# Patient Record
Sex: Male | Born: 1955 | Race: White | Hispanic: No | Marital: Married | Smoking: Current every day smoker
Health system: Southern US, Community
[De-identification: ages and names within clinical notes are randomized; demographics above are authoritative.]

## PROBLEM LIST (undated history)

## (undated) DIAGNOSIS — I10 Essential (primary) hypertension: Secondary | ICD-10-CM

## (undated) HISTORY — PX: ABDOMINAL SURGERY: SHX537

## (undated) HISTORY — DX: Essential (primary) hypertension: I10

---

## 2009-11-29 ENCOUNTER — Encounter: Admission: RE | Admit: 2009-11-29 | Discharge: 2009-11-29 | Payer: Self-pay | Admitting: Internal Medicine

## 2013-02-16 ENCOUNTER — Other Ambulatory Visit: Payer: Self-pay | Admitting: Internal Medicine

## 2013-02-16 ENCOUNTER — Ambulatory Visit
Admission: RE | Admit: 2013-02-16 | Discharge: 2013-02-16 | Disposition: A | Discharge status: Home / Self Care | Payer: 59 | Source: Ambulatory Visit | Attending: Internal Medicine | Admitting: Internal Medicine

## 2013-02-16 DIAGNOSIS — R52 Pain, unspecified: Secondary | ICD-10-CM

## 2013-02-16 DIAGNOSIS — F17200 Nicotine dependence, unspecified, uncomplicated: Secondary | ICD-10-CM

## 2013-02-17 ENCOUNTER — Other Ambulatory Visit (HOSPITAL_BASED_OUTPATIENT_CLINIC_OR_DEPARTMENT_OTHER): Payer: Self-pay | Admitting: Internal Medicine

## 2013-02-17 ENCOUNTER — Other Ambulatory Visit: Payer: Self-pay | Admitting: Internal Medicine

## 2013-02-17 DIAGNOSIS — R52 Pain, unspecified: Secondary | ICD-10-CM

## 2013-02-17 DIAGNOSIS — R109 Unspecified abdominal pain: Secondary | ICD-10-CM

## 2013-02-18 ENCOUNTER — Other Ambulatory Visit (HOSPITAL_BASED_OUTPATIENT_CLINIC_OR_DEPARTMENT_OTHER): Payer: 59

## 2013-02-19 ENCOUNTER — Other Ambulatory Visit: Payer: 59

## 2013-02-25 ENCOUNTER — Ambulatory Visit
Admission: RE | Admit: 2013-02-25 | Discharge: 2013-02-25 | Disposition: A | Discharge status: Home / Self Care | Payer: 59 | Source: Ambulatory Visit | Attending: Internal Medicine | Admitting: Internal Medicine

## 2013-02-25 DIAGNOSIS — R52 Pain, unspecified: Secondary | ICD-10-CM

## 2013-02-26 ENCOUNTER — Other Ambulatory Visit (HOSPITAL_COMMUNITY): Payer: Self-pay | Admitting: Internal Medicine

## 2013-02-26 DIAGNOSIS — R1011 Right upper quadrant pain: Secondary | ICD-10-CM

## 2013-03-04 ENCOUNTER — Other Ambulatory Visit (HOSPITAL_COMMUNITY): Payer: 59

## 2013-03-09 ENCOUNTER — Encounter (HOSPITAL_COMMUNITY): Payer: 59

## 2015-09-07 ENCOUNTER — Other Ambulatory Visit: Payer: Self-pay | Admitting: Internal Medicine

## 2015-09-07 ENCOUNTER — Ambulatory Visit
Admission: RE | Admit: 2015-09-07 | Discharge: 2015-09-07 | Disposition: A | Discharge status: Home / Self Care | Payer: 59 | Source: Ambulatory Visit | Attending: Internal Medicine | Admitting: Internal Medicine

## 2015-09-07 DIAGNOSIS — R52 Pain, unspecified: Secondary | ICD-10-CM

## 2015-09-14 ENCOUNTER — Encounter (INDEPENDENT_AMBULATORY_CARE_PROVIDER_SITE_OTHER): Payer: Self-pay

## 2015-09-14 ENCOUNTER — Ambulatory Visit (INDEPENDENT_AMBULATORY_CARE_PROVIDER_SITE_OTHER): Payer: 59 | Admitting: Rehabilitative and Restorative Service Providers"

## 2015-09-14 ENCOUNTER — Encounter: Payer: Self-pay | Admitting: Rehabilitative and Restorative Service Providers"

## 2015-09-14 DIAGNOSIS — R29898 Other symptoms and signs involving the musculoskeletal system: Secondary | ICD-10-CM

## 2015-09-14 DIAGNOSIS — M5441 Lumbago with sciatica, right side: Secondary | ICD-10-CM

## 2015-09-14 DIAGNOSIS — Z7409 Other reduced mobility: Secondary | ICD-10-CM | POA: Diagnosis not present

## 2015-09-14 DIAGNOSIS — M2560 Stiffness of unspecified joint, not elsewhere classified: Secondary | ICD-10-CM | POA: Diagnosis not present

## 2015-09-14 DIAGNOSIS — R531 Weakness: Secondary | ICD-10-CM

## 2015-09-14 DIAGNOSIS — R6889 Other general symptoms and signs: Secondary | ICD-10-CM

## 2015-09-14 NOTE — Patient Instructions (Signed)
Trunk Extension   Standing, place back of open hands on low back. Straighten spine then arch the back and move shoulders back. Repeat ___2-3_ times per session. Pause 1-2 seconds. Do __several times/day Variation: Seated   Trunk: Prone Extension (Press-Ups)   Lie on stomach on firm, flat surface. Relax bottom and legs. Raise chest in air with elbows straight. Keep hips flat on surface, sag stomach. Hold _2-3___ seconds. Repeat __10__ times. Do _at least 3 times/day - more as needed. CAUTION: Movement should be gentle and slow.  Abdominal Bracing With Pelvic Floor (Hook-Lying)   With neutral spine, tighten pelvic floor and abdominals - suck belly button to back bone, tighten back muscles at waist. Hold 10 sec. Repeat __10_ times. Do _several__ times a day. Work toward doing this in sitting, standing, walking and with functional activities.

## 2015-09-14 NOTE — Therapy (Signed)
[facility] [place] [address] [place], [place], [address] Phone: [phone]   Fax:  [phone]  Physical Therapy Evaluation  Patient Details  Name: Matthew Escobar MRN: [id number] Date of Birth: [birth year] Referring Provider:  Ralene Ok, MD  Encounter Date: 09/14/2015      PT End of Session - 09/14/15 1315    Visit Number 1   Number of Visits 12   Date for PT Re-Evaluation 10/26/15   PT Start Time 1158   PT Stop Time 1259   PT Time Calculation (min) 61 min   Activity Tolerance Patient tolerated treatment well      History reviewed. No pertinent past medical history.  History reviewed. No pertinent past surgical history.  There were no vitals filed for this visit.  Visit Diagnosis:  Bilateral low back pain with right-sided sciatica - Plan: PT plan of care cert/re-cert  Stiffness in joint - Plan: PT plan of care cert/re-cert  Weakness of right lower extremity - Plan: PT plan of care cert/re-cert  Decreased strength, endurance, and mobility - Plan: PT plan of care cert/re-cert      Subjective Assessment - 09/14/15 1159    Subjective Patient reports that he was at a store squatting to lift a box of caulk weighing ~30 pounds and felt severe pain in LB. Had to walk bent forward. Then he started having muscle spasms which have continued since time of injury ~2 weeks ago.    Pertinent History History of recurrent LBP due to work tasks. Episodes of LBP occuring 2-3 times per year for the past 2-3 years. History of LBP with chiropractic care for several years. HTN; elevated cholestrol; borderline AODM; Fx Rt tib/fib 1996 ORIF - leg length difference treated with lift by chiropractor   How long can you sit comfortably? 30  min - feels back tighening up - has pain in LB when he stands to walk    How long can you stand comfortably? no limit   How long can you walk comfortably? pain when he starts walking for the 2-3 min then  no limit   Diagnostic tests xray (-)   Patient Stated Goals getrid of the pain   Currently in Pain? Yes   Pain Score 8    Pain Location Back   Pain Orientation Right   Pain Type Acute pain   Pain Radiating Towards Rt back and into the Rt hip at times down side of the Rt to toes - muscle spasms toward mid back ~6 inches   Pain Onset 1 to 4 weeks ago   Pain Frequency Constant   Aggravating Factors  sitting   Pain Relieving Factors standing; walking; lying flat            [facility] PT Assessment - 09/14/15 0001    Assessment   Medical Diagnosis LBP   Onset Date/Surgical Date 08/31/15   Hand Dominance Left   Next MD Visit 09/19/15   Prior Therapy none this incident - PT at ~59 yr old for LB   Precautions   Precautions None   Balance Screen   Has the patient fallen in the past 6 months No   Has the patient had a decrease in activity level because of a fear of falling?  No   Is the patient reluctant to leave their home because of a fear of falling?  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   Leisure corn hole watching races; yard work; household chores to help wife; walking 3 x/wk ~ 60 min   Observation/Other Assessments   Focus on Therapeutic Outcomes (FOTO)  61% limitaion   Sensation   Additional Comments Some numbness into Rt LE depends on activity level intermittent with work activities   Posture/Postural Control   Posture/Postural Control --  leg length difference standing/supine Rt shorter ~ 3/8 inch   Posture Comments head forward increased thoracic kyphosis; decresaed lumbar lordosis; Rt hemipelvis lower than Lt   AROM   Lumbar Flexion 75%  pain   Lumbar Extension 60%  pain   Lumbar - Right Side Bend 55%  pinch    Lumbar - Left Side Bend 50%  pain/pull Rt LB   Lumbar - Right Rotation 25%  pull/pain   Lumbar - Left Rotation 15%  pain Rt    Strength   Strength Assessment Site --  Bilat LE's 5/5 except Rt hip extension 5-/5   Flexibility   Soft Tissue Assessment /Muscle Length --  Lt knee to chest produced pain in the Rt LB   Hamstrings Rt 67 deg; Lt 79 deg   ITB tight bilat   Piriformis tight Lt > Rt   Palpation   Spinal mobility pain with LPA mobs L5   Palpation comment tightness Rt pelvic crest distal to SI joint; hip abductors   Special Tests    Special Tests --  SLR; figure 4; prone knee flexion (-)                   [facility] Adult PT Treatment/Exercise - 09/14/15 0001    Self-Care   Self-Care --  education re spine care; body mechanics; positioning   Lumbar Exercises: Stretches   Standing Extension 5 reps  2-3 sec   Prone on Elbows Stretch --  2-3 min    Press Ups --  10 reps 1-2 sec hold   Lumbar Exercises: Supine   Ab Set --  3 part core difficulty with multifidi 10 sec hold 10 reps   Cryotherapy   Number Minutes Cryotherapy 15 Minutes   Cryotherapy Location Lumbar Spine   Type of Cryotherapy Ice pack   Electrical Stimulation   Electrical Stimulation Location bilat L5; Rt hip   Electrical Stimulation Action IFC   Electrical Stimulation Parameters to tolerance   Electrical Stimulation Goals Pain                PT Education - 09/14/15 1256    Education provided Yes   Education Details Education re LBP discogenic in nature; ergonomic positioning; extensioin program; HEP; trial of 1/4 inch lift for Rt shoe             PT Long Term Goals - 09/14/15 1320    PT LONG TERM GOAL #1   Title Patient I in HEP at discharge 10/26/15   Time 6   Period Weeks   Status New   PT LONG TERM GOAL #2   Title 5/5 strength Rt hip extension/LE 10/26/15   Time 6   Period Weeks   Status New   PT LONG TERM GOAL #3   Title Hamstring flexibility 75-80 degress bilat  10/26/15   Time 6   Period Weeks   Status New   PT LONG TERM GOAL #4  Title Increased sitting tolerance to 60 min 10/26/15   Time 6   Period Weeks   Status New   PT LONG TERM GOAL #5   Title Improve FOTO to </= 35% limitation 10/26/15   Time 6   Period Weeks   Status New               Plan - 09/14/15 1316    Clinical Impression Statement Patient presents with acute LBP which is recurrent in nature including 2-3 episodes/year. He has limited and painful trunk mobility; decreased flexibility bilat LE's; weakness; pain with functioinal acitivities; limited endurance for functioinal activities; pain on a constant , daily basis. He also has a leg length difference Rt shorter than Lt. Patient will benefit from Physical Therapy to address problems identifited.    Pt will benefit from skilled therapeutic intervention in order to improve on the following deficits Improper body mechanics;Postural dysfunction;Decreased range of motion;Decreased mobility;Decreased strength;Decreased endurance;Decreased activity tolerance;Pain   Rehab Potential Good   PT Frequency 2x / week   PT Duration 6 weeks   PT Treatment/Interventions Patient/family education;ADLs/Self Care Home Management;Therapeutic exercise;Therapeutic activities;Cryotherapy;Electrical Stimulation;Moist Heat;Ultrasound;Dry needling;Manual techniques   PT Next Visit Plan assess response to heel lift and HEP; progress core stabilization; extension program   PT Home Exercise Plan positioning; extension program; ~1/4 inchheel lift Rt   Consulted and Agree with Plan of Care Patient         Problem List There are no active problems to display for this patient.   Celyn [name] PT, MPH 09/14/2015, 1:34 PM  [facility] [address] [place], [place], [address] Phone: [phone]   Fax:  [phone]

## 2015-09-19 ENCOUNTER — Ambulatory Visit (INDEPENDENT_AMBULATORY_CARE_PROVIDER_SITE_OTHER): Payer: 59 | Admitting: Rehabilitative and Restorative Service Providers"

## 2015-09-19 ENCOUNTER — Encounter: Payer: Self-pay | Admitting: Rehabilitative and Restorative Service Providers"

## 2015-09-19 DIAGNOSIS — M2560 Stiffness of unspecified joint, not elsewhere classified: Secondary | ICD-10-CM

## 2015-09-19 DIAGNOSIS — M5441 Lumbago with sciatica, right side: Secondary | ICD-10-CM

## 2015-09-19 DIAGNOSIS — Z7409 Other reduced mobility: Secondary | ICD-10-CM | POA: Diagnosis not present

## 2015-09-19 DIAGNOSIS — R29898 Other symptoms and signs involving the musculoskeletal system: Secondary | ICD-10-CM

## 2015-09-19 DIAGNOSIS — R6889 Other general symptoms and signs: Secondary | ICD-10-CM

## 2015-09-19 DIAGNOSIS — R531 Weakness: Secondary | ICD-10-CM

## 2015-09-19 NOTE — Therapy (Signed)
[facility] [place] [address] [place], [place], [address] Phone: [phone]   Fax:  [phone]  Physical Therapy Treatment  Patient Details  Name: Matthew Escobar MRN: [id number] Date of Birth: [birth year] Referring Provider:  Ralene Ok, MD  Encounter Date: 09/19/2015      PT End of Session - 09/19/15 1404    Visit Number 2   Number of Visits 12   Date for PT Re-Evaluation 10/26/15   PT Start Time 1405   PT Stop Time 1458   PT Time Calculation (min) 53 min   Activity Tolerance Patient tolerated treatment well      History reviewed. No pertinent past medical history.  History reviewed. No pertinent past surgical history.  There were no vitals filed for this visit.  Visit Diagnosis:  Bilateral low back pain with right-sided sciatica  Stiffness in joint  Weakness of right lower extremity  Decreased strength, endurance, and mobility      Subjective Assessment - 09/19/15 1409    Subjective Patient reports that he continues to have some muscle spasms just not as intense. He states that his back is 'no better, no worse'.    Currently in Pain? Yes   Pain Score 8    Pain Location Back   Pain Orientation Right   Pain Descriptors / Indicators Throbbing;Aching;Pounding   Pain Type Acute pain   Pain Onset 1 to 4 weeks ago   Pain Frequency Constant            [facility] PT Assessment - 09/19/15 0001    Assessment   Medical Diagnosis LBP   Onset Date/Surgical Date 08/31/15   Hand Dominance Left   Next MD Visit 09/19/15   Prior Therapy none this incident - PT at ~59 yr old for LB   AROM   Lumbar Extension 60%  pain   Palpation   Spinal mobility pain with LPA mobs L5   Palpation comment tightness Rt pelvic crest distal to SI joint; hip abductors                     [facility] Adult PT Treatment/Exercise - 09/19/15 0001    Self-Care   Self-Care --  education re spine care; body mechanics; positioning    Lumbar Exercises: Stretches   Standing Extension 5 reps  2-3 sec   Prone on Elbows Stretch --  2-3 min    Press Ups --  10 reps 1-2 sec hold   Lumbar Exercises: Machines for Strengthening   Other Lumbar Machine Exercise UBE L2 4 min 2 frd 2 back   Lumbar Exercises: Supine   Ab Set --  3 part core difficulty with multifidi 10 sec hold 10 reps   Cryotherapy   Number Minutes Cryotherapy 15 Minutes   Cryotherapy Location Lumbar Spine   Type of Cryotherapy Ice pack   Electrical Stimulation   Electrical Stimulation Location bilat L5; Rt hip   Electrical Stimulation Action IFC   Electrical Stimulation Parameters to tolerance   Electrical Stimulation Goals Pain   Ultrasound   Ultrasound Location Rt lumbar and SI area   Ultrasound Parameters 1 mHz; 1.5 w/cm2; 100%; 8 min   Ultrasound Goals Pain  muscle tightness   Manual Therapy   Joint Mobilization CPA mobs grade II L4/5/S1; Rt transverse processes L5/4   Soft tissue mobilization deep tissue work through [employer] lumbar/QL/SI area    Myofascial Release lumbar release using the edge tool  PT Education - 09/19/15 1551    Education provided Yes   Education Details continue education re-spine care; ergonomics; cont with lift; HEP - extension program   Person(s) Educated Patient   Methods Explanation   Comprehension Verbalized understanding             PT Long Term Goals - 09/14/15 1320    PT LONG TERM GOAL #1   Title Patient I in HEP at discharge 10/26/15   Time 6   Period Weeks   Status New   PT LONG TERM GOAL #2   Title 5/5 strength Rt hip extension/LE 10/26/15   Time 6   Period Weeks   Status New   PT LONG TERM GOAL #3   Title Hamstring flexibility 75-80 degress bilat 10/26/15   Time 6   Period Weeks   Status New   PT LONG TERM GOAL #4   Title Increased sitting tolerance to 60 min 10/26/15   Time 6   Period Weeks   Status New   PT LONG TERM GOAL #5   Title Improve FOTO to </= 35%  limitation 10/26/15   Time 6   Period Weeks   Status New               Plan - 09/19/15 1413    Clinical Impression Statement Cont pain which appears discogenic in nature. Cont limited and painful trunk mobility; decreased flexibility bilat LE's; weakness; pain with functional activities; pain. He states that he has adjusted to the lift in Rt LE. Pain persists. No goals accomplished.   Pt will benefit from skilled therapeutic intervention in order to improve on the following deficits Improper body mechanics;Postural dysfunction;Decreased range of motion;Decreased mobility;Decreased strength;Decreased endurance;Decreased activity tolerance;Pain   Rehab Potential Good   PT Frequency 2x / week   PT Duration 6 weeks   PT Treatment/Interventions Patient/family education;ADLs/Self Care Home Management;Therapeutic exercise;Therapeutic activities;Cryotherapy;Electrical Stimulation;Moist Heat;Ultrasound;Dry needling;Manual techniques   PT Next Visit Plan assess response to heel lift and HEP; progress core stabilization; extension program; add supine hamstring stretch   PT Home Exercise Plan positioning; extension program; ~1/4 inchheel lift Rt   Consulted and Agree with Plan of Care Patient        Problem List There are no active problems to display for this patient.   Latia Mataya [name] PT, MPH 09/19/2015, 4:00 PM  [facility] [address] [place], [place], [address] Phone: [phone]   Fax:  [phone]

## 2015-09-21 ENCOUNTER — Ambulatory Visit (INDEPENDENT_AMBULATORY_CARE_PROVIDER_SITE_OTHER): Payer: 59 | Admitting: Physical Therapy

## 2015-09-21 DIAGNOSIS — Z7409 Other reduced mobility: Secondary | ICD-10-CM

## 2015-09-21 DIAGNOSIS — M2560 Stiffness of unspecified joint, not elsewhere classified: Secondary | ICD-10-CM

## 2015-09-21 DIAGNOSIS — M5441 Lumbago with sciatica, right side: Secondary | ICD-10-CM | POA: Diagnosis not present

## 2015-09-21 DIAGNOSIS — R29898 Other symptoms and signs involving the musculoskeletal system: Secondary | ICD-10-CM | POA: Diagnosis not present

## 2015-09-21 DIAGNOSIS — R531 Weakness: Secondary | ICD-10-CM

## 2015-09-21 NOTE — Therapy (Signed)
[facility] [place] [address] [place], [place], [address] Phone: [phone]   Fax:  [phone]  Physical Therapy Treatment  Patient Details  Name: Matthew Escobar MRN: [id number] Date of Birth: [birth year] Referring Provider:  Ralene Ok, MD  Encounter Date: 09/21/2015      PT End of Session - 09/21/15 1406    Visit Number 3   Number of Visits 12   Date for PT Re-Evaluation 10/26/15   PT Start Time 1403   PT Stop Time 1456   PT Time Calculation (min) 53 min   Activity Tolerance Patient tolerated treatment well      No past medical history on file.  No past surgical history on file.  There were no vitals filed for this visit.  Visit Diagnosis:  Bilateral low back pain with right-sided sciatica  Stiffness in joint  Weakness of right lower extremity  Decreased strength, endurance, and mobility      Subjective Assessment - 09/21/15 1408    Subjective Pt reports his pain went from an 8/10 to 2/10 after last session.  Pain has increased since then, but not as high. Pt reports he is performing HEP 2x/day.  Pt notes he is able to sit 1 hr 15 min before increased pain.    Currently in Pain? Yes   Pain Score 4    Pain Location Back   Pain Orientation Right   Pain Descriptors / Indicators Aching   Aggravating Factors  sitting    Pain Relieving Factors standing, walking, lying flat, TENS unit.             [facility] PT Assessment - 09/21/15 0001    Assessment   Medical Diagnosis LBP   Onset Date/Surgical Date 08/31/15   Hand Dominance Left   Next MD Visit January                      [facility] Adult PT Treatment/Exercise - 09/21/15 0001    Lumbar Exercises: Stretches   Passive Hamstring Stretch 2 reps;30 seconds   Passive Hamstring Stretch Limitations (supine with strap)   Prone on Elbows Stretch --  2-3 min    Press Ups --  10 reps 1-2 sec hold   Lumbar Exercises: Aerobic   Stationary Bike NuStep  L4: 5 min    Lumbar Exercises: Supine   Ab Set 5 reps;5 seconds   Clam 10 reps  each leg with ab set   Heel Slides 10 reps  each leg with ab set   Bent Knee Raise 20 reps  each leg with ab set   Lumbar Exercises: Prone   Other Prone Lumbar Exercises Prone pelvic press with 5 sec hold x 5 reps (required tactile cues and demo for form)    Programme researcher, broadcasting/film/video Location Rt lumbar paraspinals / SI joint/ glute med    Teacher, music Stimulation Goals Pain   Ultrasound   Ultrasound Location Rt lumbar paraspinals/ SI/.glute med   Ultrasound Parameters 1 mHz, 1.3 w/cm2, 8 min 100%.    Ultrasound Goals Pain                PT Education - 09/21/15 1504    Education provided Yes   Education Details HEP - added hamstring stretch and trans abd series.  Also educated on log roll, core engagement with transitional movements.     Person(s) Educated Patient   Methods Explanation;Handout   Comprehension  Returned demonstration;Verbalized understanding             PT Long Term Goals - 09/14/15 1320    PT LONG TERM GOAL #1   Title Patient I in HEP at discharge 10/26/15   Time 6   Period Weeks   Status New   PT LONG TERM GOAL #2   Title 5/5 strength Rt hip extension/LE 10/26/15   Time 6   Period Weeks   Status New   PT LONG TERM GOAL #3   Title Hamstring flexibility 75-80 degress bilat 10/26/15   Time 6   Period Weeks   Status New   PT LONG TERM GOAL #4   Title Increased sitting tolerance to 60 min 10/26/15   Time 6   Period Weeks   Status New   PT LONG TERM GOAL #5   Title Improve FOTO to </= 35% limitation 10/26/15   Time 6   Period Weeks   Status New               Plan - 09/21/15 1505    Clinical Impression Statement Pt reporting decreased pain to 4/10 today. Pt tolerated all new exercises without increase in pain. Pt able to demo proper body mechanics (with VC) for transitions like supine to sit  and sit to stand.  Pt reported further reduction in pain at end of session to 2/10. Progressing towards goals.     Pt will benefit from skilled therapeutic intervention in order to improve on the following deficits Improper body mechanics;Postural dysfunction;Decreased range of motion;Decreased mobility;Decreased strength;Decreased endurance;Decreased activity tolerance;Pain   Rehab Potential Good   PT Frequency 2x / week   PT Duration 6 weeks   PT Treatment/Interventions Patient/family education;ADLs/Self Care Home Management;Therapeutic exercise;Therapeutic activities;Cryotherapy;Electrical Stimulation;Moist Heat;Ultrasound;Dry needling;Manual techniques   PT Next Visit Plan Continue progressive strengthening of core and stretching of LE.   Continue extension program.    Consulted and Agree with Plan of Care Patient        Problem List There are no active problems to display for this patient.  Mayer Camel, PTA 09/21/2015 3:12 PM  [facility] [place] [address] [place], [place], [address] Phone: [phone]   Fax:  [phone]

## 2015-09-26 ENCOUNTER — Encounter: Payer: Self-pay | Admitting: Family Medicine

## 2015-09-26 ENCOUNTER — Encounter: Payer: Self-pay | Admitting: Rehabilitative and Restorative Service Providers"

## 2015-09-26 ENCOUNTER — Ambulatory Visit (INDEPENDENT_AMBULATORY_CARE_PROVIDER_SITE_OTHER): Payer: 59 | Admitting: Family Medicine

## 2015-09-26 ENCOUNTER — Ambulatory Visit (INDEPENDENT_AMBULATORY_CARE_PROVIDER_SITE_OTHER): Payer: 59 | Admitting: Rehabilitative and Restorative Service Providers"

## 2015-09-26 VITALS — BP 155/96 | HR 80 | Wt 234.0 lb

## 2015-09-26 DIAGNOSIS — R531 Weakness: Secondary | ICD-10-CM

## 2015-09-26 DIAGNOSIS — Z7409 Other reduced mobility: Secondary | ICD-10-CM

## 2015-09-26 DIAGNOSIS — M5430 Sciatica, unspecified side: Secondary | ICD-10-CM

## 2015-09-26 DIAGNOSIS — M5440 Lumbago with sciatica, unspecified side: Secondary | ICD-10-CM

## 2015-09-26 DIAGNOSIS — R6889 Other general symptoms and signs: Secondary | ICD-10-CM

## 2015-09-26 DIAGNOSIS — R29898 Other symptoms and signs involving the musculoskeletal system: Secondary | ICD-10-CM | POA: Diagnosis not present

## 2015-09-26 DIAGNOSIS — M2560 Stiffness of unspecified joint, not elsewhere classified: Secondary | ICD-10-CM | POA: Diagnosis not present

## 2015-09-26 DIAGNOSIS — M545 Low back pain: Secondary | ICD-10-CM

## 2015-09-26 DIAGNOSIS — M5416 Radiculopathy, lumbar region: Secondary | ICD-10-CM | POA: Insufficient documentation

## 2015-09-26 MED ORDER — TRAMADOL HCL 50 MG PO TABS: 50.0000 mg | ORAL_TABLET | Freq: Three times a day (TID) | ORAL | Status: DC | PRN

## 2015-09-26 NOTE — Assessment & Plan Note (Signed)
Patient is improving with conservative management.  Plan to continue prednisone physical therapy and Flexeril. Tramadol for pain control. Return in 2 weeks. If not better at that time would obtain MRI for planning for epidural steroid injection.

## 2015-09-26 NOTE — Progress Notes (Signed)
   Subjective:    I'm seeing this patient as a consultation for:  Dr Josefa Half Fax # [phone]  CC: Back pain  HPI: Patient is a 4 week history of right-sided back pain radiating to the right foot. He denies any weakness or numbness bowel bladder dysfunction. He denies any injury. No fevers chills nausea vomiting or diarrhea. He's been seen by his primary care provider who has obtained an x-ray which was reportedly normal, and prescribed prednisone and Flexeril. Additionally he said a few doses of Norco. Medications are mildly helpful. Additionally he has had a few days of physical therapy which are helpful. He notes a tingling sensation to his right leg occurring sometimes. Symptoms are typically worse with sitting and activity and better with rest.   Past medical history, Surgical history, Family history not pertinant except as noted below, Social history, Allergies, and medications have been entered into the medical record, reviewed, and no changes needed.   Review of Systems: No headache, visual changes, nausea, vomiting, diarrhea, constipation, dizziness, abdominal pain, skin rash, fevers, chills, night sweats, weight loss, swollen lymph nodes, body aches, joint swelling, muscle aches, chest pain, shortness of breath, mood changes, visual or auditory hallucinations.   Objective:    Filed Vitals:   09/26/15 1311  BP: 155/96  Pulse: 80   General: Well Developed, well nourished, and in no acute distress.  Neuro/Psych: Alert and oriented x3, extra-ocular muscles intact, able to move all 4 extremities, sensation grossly intact. Skin: Warm and dry, no rashes noted.  Respiratory: Not using accessory muscles, speaking in full sentences, trachea midline.  Cardiovascular: Pulses palpable, no extremity edema. Abdomen: Does not appear distended. MSK: Back: Nontender to spinal midline. Tender palpation right SI joint. Back range of motion is intact. Negative slump test and Faber test  bilaterally. Negative straight leg raise test bilaterally. Reflexes are hypereflexic but equal bilateral knees and ankles Normal gait. Normal sensation and strength.   X-ray L-spine 09/07/2015 reviewed normal.   No results found for this or any previous visit (from the past 24 hour(s)). No results found.  Impression and Recommendations:   This case required medical decision making of moderate complexity.

## 2015-09-26 NOTE — Patient Instructions (Signed)
Thank you for coming in today. Come back or go to the emergency room if you notice new weakness new numbness problems walking or bowel or bladder problems. Return in 2-3 weeks.   Sciatica Sciatica is pain, weakness, numbness, or tingling along the path of the sciatic nerve. The nerve starts in the lower back and runs down the back of each leg. The nerve controls the muscles in the lower leg and in the back of the knee, while also providing sensation to the back of the thigh, lower leg, and the sole of your foot. Sciatica is a symptom of another medical condition. For instance, nerve damage or certain conditions, such as a herniated disk or bone spur on the spine, pinch or put pressure on the sciatic nerve. This causes the pain, weakness, or other sensations normally associated with sciatica. Generally, sciatica only affects one side of the body. CAUSES   Herniated or slipped disc.  Degenerative disk disease.  A pain disorder involving the narrow muscle in the buttocks (piriformis syndrome).  Pelvic injury or fracture.  Pregnancy.  Tumor (rare). SYMPTOMS  Symptoms can vary from mild to very severe. The symptoms usually travel from the low back to the buttocks and down the back of the leg. Symptoms can include:  Mild tingling or dull aches in the lower back, leg, or hip.  Numbness in the back of the calf or sole of the foot.  Burning sensations in the lower back, leg, or hip.  Sharp pains in the lower back, leg, or hip.  Leg weakness.  Severe back pain inhibiting movement. These symptoms may get worse with coughing, sneezing, laughing, or prolonged sitting or standing. Also, being overweight may worsen symptoms. DIAGNOSIS  Your caregiver will perform a physical exam to look for common symptoms of sciatica. He or she may ask you to do certain movements or activities that would trigger sciatic nerve pain. Other tests may be performed to find the cause of the sciatica. These may  include:  Blood tests.  X-rays.  Imaging tests, such as an MRI or CT scan. TREATMENT  Treatment is directed at the cause of the sciatic pain. Sometimes, treatment is not necessary and the pain and discomfort goes away on its own. If treatment is needed, your caregiver may suggest:  Over-the-counter medicines to relieve pain.  Prescription medicines, such as anti-inflammatory medicine, muscle relaxants, or narcotics.  Applying heat or ice to the painful area.  Steroid injections to lessen pain, irritation, and inflammation around the nerve.  Reducing activity during periods of pain.  Exercising and stretching to strengthen your abdomen and improve flexibility of your spine. Your caregiver may suggest losing weight if the extra weight makes the back pain worse.  Physical therapy.  Surgery to eliminate what is pressing or pinching the nerve, such as a bone spur or part of a herniated disk. HOME CARE INSTRUCTIONS   Only take over-the-counter or prescription medicines for pain or discomfort as directed by your caregiver.  Apply ice to the affected area for 20 minutes, 3-4 times a day for the first 48-72 hours. Then try heat in the same way.  Exercise, stretch, or perform your usual activities if these do not aggravate your pain.  Attend physical therapy sessions as directed by your caregiver.  Keep all follow-up appointments as directed by your caregiver.  Do not wear high heels or shoes that do not provide proper support.  Check your mattress to see if it is too soft. A firm  mattress may lessen your pain and discomfort. SEEK IMMEDIATE MEDICAL CARE IF:   You lose control of your bowel or bladder (incontinence).  You have increasing weakness in the lower back, pelvis, buttocks, or legs.  You have redness or swelling of your back.  You have a burning sensation when you urinate.  You have pain that gets worse when you lie down or awakens you at night.  Your pain is worse  than you have experienced in the past.  Your pain is lasting longer than 4 weeks.  You are suddenly losing weight without reason. MAKE SURE YOU:  Understand these instructions.  Will watch your condition.  Will get help right away if you are not doing well or get worse.   This information is not intended to replace advice given to you by your health care provider. Make sure you discuss any questions you have with your health care provider.   Document Released: 11/27/2001 Document Revised: 08/24/2015 Document Reviewed: 04/13/2012 Elsevier Interactive Patient Education [employer]

## 2015-09-26 NOTE — Patient Instructions (Signed)
Strengthening: Wall Slide    Leaning on wall, tighten core! slowly lower buttocks until thighs are parallel to floor. Hold __10__ seconds. Tighten thigh muscles and return. Repeat __10__ times per set. Do __1-3__ sets per session. Do _2___ sessions per day.

## 2015-09-26 NOTE — Therapy (Signed)
[facility] [place] [address] [place] [address] [place], [place], [address] Phone: [phone]   Fax:  [phone]  Physical Therapy Treatment  Patient Details  Name: Matthew Escobar MRN: [id number] Date of Birth: [birth year] Referring Provider:  Ralene Ok, MD  Encounter Date: 09/26/2015      PT End of Session - 09/26/15 1503    Visit Number 4   Number of Visits 12   Date for PT Re-Evaluation 10/26/15   PT Start Time 1404   PT Stop Time 1503   PT Time Calculation (min) 59 min   Activity Tolerance Patient tolerated treatment well      History reviewed. No pertinent past medical history.  History reviewed. No pertinent past surgical history.  There were no vitals filed for this visit.  Visit Diagnosis:  Stiffness in joint  Weakness of right lower extremity  Decreased strength, endurance, and mobility      Subjective Assessment - 09/26/15 1403    Subjective Patient reports that his back is "pretty good". [place] and did a lot of walking without any pain. - had increased pain following last PT visit. May have been the new exercises. Seen by Dr. Denyse Amass today. To continue with PT for 2 more weeks and if symptoms persist he will consider an MRI   Currently in Pain? Yes   Pain Score 2    Pain Location Back   Pain Orientation Right   Pain Descriptors / Indicators Aching;Tingling   Pain Type Acute pain   Pain Radiating Towards Rt LB to Rt hip - then some tingling in the bottom of the right foot    Pain Onset 1 to 4 weeks ago   Pain Frequency Intermittent   Aggravating Factors  sitting but not as bad   Pain Relieving Factors standing; walking; lying flat; TENS unit            [facility] PT Assessment - 09/26/15 0001    Flexibility   Hamstrings 69 deg Rt; Lt 78 deg   Palpation   Spinal mobility min pain with spring testing L5 - Rt                      [facility] Adult PT Treatment/Exercise -  09/26/15 0001    Therapeutic Activites    Therapeutic Activities --  myofacial ball release work Rt piriformis    Lumbar Exercises: Stretches   Passive Hamstring Stretch 2 reps;30 seconds   Passive Hamstring Stretch Limitations (supine with strap)   Prone on Elbows Stretch --  2-3 min    Press Ups --  10 reps 1-2 sec hold   Lumbar Exercises: Standing   Wall Slides 10 reps  10 sec hold    Lumbar Exercises: Supine   Ab Set 10 reps  10 sec hold   Clam 10 reps  each leg with ab set   Heel Slides 10 reps  each leg with ab set; heel sliding on table   Bent Knee Raise 10 reps  with core engaged   Lumbar Exercises: Prone   Other Prone Lumbar Exercises prone pelvic press 5 reps 5 sec hold VC to avoid using mm in hips/legs   Cryotherapy   Number Minutes Cryotherapy 15 Minutes   Cryotherapy Location Lumbar Spine  Rt hip   Type of Cryotherapy Ice pack   Electrical Stimulation   Electrical Stimulation Location Rt lumbar L5 area and laterally; Rt piriformis origin and mid buttock  Electrical Stimulation Action IFC   Electrical Stimulation Parameters to tolerance   Electrical Stimulation Goals Pain  muscle tightness   Ultrasound   Ultrasound Location Rt lumbar/piriformis area   Ultrasound Parameters 1 mHz; 100% 1.4 w/cm2; 8 min   Ultrasound Goals --  muscular tightness    Manual Therapy   Joint Mobilization CPA mobs grade II L4/5/S1; Rt transverse processes L5/4   Soft tissue mobilization deep tissue work through [employer] area                 PT Education - 09/26/15 1431    Education provided (p) Yes   Education Details (p) working on core stabilization modified exercises added wall slide working on [employer] operational officer) Educated (p) Patient   Methods (p) Explanation;Demonstration;Verbal cues   Comprehension (p) Verbalized understanding;Returned demonstration;Verbal cues required;Tactile cues required             PT Long Term Goals - 09/26/15 1506     PT LONG TERM GOAL #1   Title Patient I in HEP at discharge 10/26/15   Time 6   Period Weeks   Status On-going   PT LONG TERM GOAL #2   Title 5/5 strength Rt hip extension/LE 10/26/15   Time 6   Period Weeks   Status On-going   PT LONG TERM GOAL #3   Title Hamstring flexibility 75-80 degress bilat 10/26/15   Time 6   Period Weeks   Status On-going   PT LONG TERM GOAL #4   Title Increased sitting tolerance to 60 min 10/26/15   Time 6   Period Weeks   Status On-going   PT LONG TERM GOAL #5   Title Improve FOTO to </= 35% limitation 10/26/15   Time 6   Period Weeks   Status On-going               Plan - 09/26/15 1503    Clinical Impression Statement Some significant increase in pain following last visit better day yesterday. improved symptoms when he stays out of sitting and does more walking. Responds well to [place] and deep tissue release work through [employer] area. Reports good improvement overall and increased activity/work tolerance. Progressing toward stated goals of therapy.    Pt will benefit from skilled therapeutic intervention in order to improve on the following deficits Improper body mechanics;Postural dysfunction;Decreased range of motion;Decreased mobility;Decreased strength;Decreased endurance;Decreased activity tolerance;Pain   Rehab Potential Good   PT Frequency 2x / week   PT Duration 6 weeks   PT Treatment/Interventions Patient/family education;ADLs/Self Care Home Management;Therapeutic exercise;Therapeutic activities;Cryotherapy;Electrical Stimulation;Moist Heat;Ultrasound;Dry needling;Manual techniques   PT Next Visit Plan Continue deep tissue work;  progressive strengthening of core and stretching of LE.   Continue extension program.    PT Home Exercise Plan positioning; extension program; ~1/4 inchheel lift Rt   Consulted and Agree with Plan of Care Patient        Problem List Patient Active Problem List   Diagnosis Date Noted  .  Lumbago of lumbar region with sciatica 09/26/2015    Joyce Leckey [name] PT, MPH 09/26/2015, 3:07 PM  [facility] [address] [place] [address] [place], [place], [address] Phone: [phone]   Fax:  [phone]

## 2015-09-28 ENCOUNTER — Encounter: Payer: Self-pay | Admitting: Rehabilitative and Restorative Service Providers"

## 2015-09-28 ENCOUNTER — Ambulatory Visit (INDEPENDENT_AMBULATORY_CARE_PROVIDER_SITE_OTHER): Payer: 59 | Admitting: Rehabilitative and Restorative Service Providers"

## 2015-09-28 DIAGNOSIS — Z7409 Other reduced mobility: Secondary | ICD-10-CM | POA: Diagnosis not present

## 2015-09-28 DIAGNOSIS — R29898 Other symptoms and signs involving the musculoskeletal system: Secondary | ICD-10-CM

## 2015-09-28 DIAGNOSIS — M2560 Stiffness of unspecified joint, not elsewhere classified: Secondary | ICD-10-CM | POA: Diagnosis not present

## 2015-09-28 DIAGNOSIS — M5441 Lumbago with sciatica, right side: Secondary | ICD-10-CM

## 2015-09-28 DIAGNOSIS — R531 Weakness: Secondary | ICD-10-CM

## 2015-09-28 NOTE — Therapy (Signed)
[facility] [place] [address] [place] [address] [place], [place], [address] Phone: [phone]   Fax:  [phone]  Physical Therapy Treatment  Patient Details  Name: Matthew Escobar MRN: [id number] Date of Birth: 1955-12-27 Referring Provider:  Ralene Ok, MD  Encounter Date: 09/28/2015      PT End of Session - 09/28/15 1605    Visit Number 5   Number of Visits 12   Date for PT Re-Evaluation 10/26/15   PT Start Time 1407   PT Stop Time 1456   PT Time Calculation (min) 49 min   Activity Tolerance Patient tolerated treatment well      History reviewed. No pertinent past medical history.  History reviewed. No pertinent past surgical history.  There were no vitals filed for this visit.  Visit Diagnosis:  Stiffness in joint  Weakness of right lower extremity  Decreased strength, endurance, and mobility  Bilateral low back pain with right-sided sciatica      Subjective Assessment - 09/28/15 1410    Subjective Patient reports that he felt Okay after last trreatment and felt good yesterday but pain is back today. He has been active with work but not lifting or crawling around with work. 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    Currently in Pain? Yes   Pain Score 4    Pain Location Back   Pain Orientation Right   Pain Descriptors / Indicators Aching;Tingling   Pain Type Acute pain   Pain Radiating Towards no radiating into the Rt LE - bottom of foot was not even numb yesterday or today which is unsual.    Pain Onset 1 to 4 weeks ago                         [facility] Adult PT Treatment/Exercise - 09/28/15 0001    Lumbar Exercises: Stretches   Passive Hamstring Stretch 2 reps;30 seconds   Passive Hamstring Stretch Limitations (supine with strap)   Prone on Elbows Stretch --  2-3 min    Press Ups --  10 reps 1-2 sec hold   Lumbar Exercises: Machines for Strengthening   Other Lumbar Machine  Exercise UBE L2 4 min 2 frd 2 back   Lumbar Exercises: Standing   Wall Slides 10 reps  10 sec hold    Lumbar Exercises: Supine   Ab Set 10 reps  10 sec hold   Bent Knee Raise 10 reps  with core engaged   Lumbar Exercises: Prone   Other Prone Lumbar Exercises prone pelvic press 5 reps 5 sec hold VC to avoid using mm in hips/legs   Moist Heat Therapy   Number Minutes Moist Heat 15 Minutes   Moist Heat Location Lumbar Spine;Hip   Cryotherapy   Number Minutes Cryotherapy 15 Minutes   Cryotherapy Location Lumbar Spine   Type of Cryotherapy Ice pack   Electrical Stimulation   Electrical Stimulation Location Rt lumbar L5 area and laterally; Rt piriformis origin and mid buttock   Electrical Stimulation Action IFC   Electrical Stimulation Parameters to tolerance   Electrical Stimulation Goals Pain   Ultrasound   Ultrasound Location Rt lumbar/ piriformis area   Ultrasound Parameters ; 100%; 1.4 w/cm2; 8 min   Ultrasound Goals Pain  muscular tightness   Manual Therapy   Soft tissue mobilization soft tissue work Rt hip/iriformis area decreasede pressure   Myofascial Release lumbar to Rt hip/buttocks  PT Education - 09/28/15 1603    Education provided Yes   Education Details discussed activity level to avoid prolonged sitting - continue with postural work, core stabilization, ergonomic positioning, HEP   Person(s) Educated Patient   Methods Explanation   Comprehension Verbalized understanding             PT Long Term Goals - 09/26/15 1506    PT LONG TERM GOAL #1   Title Patient I in HEP at discharge 10/26/15   Time 6   Period Weeks   Status On-going   PT LONG TERM GOAL #2   Title 5/5 strength Rt hip extension/LE 10/26/15   Time 6   Period Weeks   Status On-going   PT LONG TERM GOAL #3   Title Hamstring flexibility 75-80 degress bilat 10/26/15   Time 6   Period Weeks   Status On-going   PT LONG TERM GOAL #4   Title Increased sitting  tolerance to 60 min 10/26/15   Time 6   Period Weeks   Status On-going   PT LONG TERM GOAL #5   Title Improve FOTO to </= 35% limitation 10/26/15   Time 6   Period Weeks   Status On-going               Plan - 09/28/15 1609    Clinical Impression Statement No pain yesterday but he 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 He did try to stand every hour or so during the day. Has pain today after sitting at office for ~30 min this morning and standing up. has had pain in the Rt hip area most of the day. Does try to tightne his core frequently thoroughout the day.    Pt will benefit from skilled therapeutic intervention in order to improve on the following deficits Improper body mechanics;Postural dysfunction;Decreased range of motion;Decreased mobility;Decreased strength;Decreased endurance;Decreased activity tolerance;Pain   PT Frequency 2x / week   PT Duration 6 weeks   PT Treatment/Interventions Patient/family education;ADLs/Self Care Home Management;Therapeutic exercise;Therapeutic activities;Cryotherapy;Electrical Stimulation;Moist Heat;Ultrasound;Dry needling;Manual techniques   PT Next Visit Plan soft tissue work;  progressive strengthening of core and stretching of LE.   Continue extension program. trial of quadraped exercises if pain is under control   PT Home Exercise Plan positioning; extension program; HEP;  ~1/4 inchheel lift Rt   Consulted and Agree with Plan of Care Patient        Problem List Patient Active Problem List   Diagnosis Date Noted  . Lumbago of lumbar region with sciatica 09/26/2015    Celyn Rober MinionP Holt  PT, MPH  09/28/2015, 4:52 PM  [facility] [address] [place] [place] [address] [place], [place], [address] Phone: [phone]   Fax:  [phone]

## 2015-10-03 ENCOUNTER — Ambulatory Visit (INDEPENDENT_AMBULATORY_CARE_PROVIDER_SITE_OTHER): Payer: 59 | Admitting: Physical Therapy

## 2015-10-03 ENCOUNTER — Encounter: Payer: Self-pay | Admitting: Physical Therapy

## 2015-10-03 DIAGNOSIS — Z7409 Other reduced mobility: Secondary | ICD-10-CM

## 2015-10-03 DIAGNOSIS — R531 Weakness: Secondary | ICD-10-CM

## 2015-10-03 DIAGNOSIS — R29898 Other symptoms and signs involving the musculoskeletal system: Secondary | ICD-10-CM | POA: Diagnosis not present

## 2015-10-03 DIAGNOSIS — M5441 Lumbago with sciatica, right side: Secondary | ICD-10-CM | POA: Diagnosis not present

## 2015-10-03 DIAGNOSIS — M2560 Stiffness of unspecified joint, not elsewhere classified: Secondary | ICD-10-CM

## 2015-10-03 DIAGNOSIS — R6889 Other general symptoms and signs: Secondary | ICD-10-CM

## 2015-10-03 NOTE — Patient Instructions (Signed)

## 2015-10-03 NOTE — Therapy (Signed)
[place], [place], [address] Phone: [phone]   Fax:  [phone]  Physical Therapy Treatment  Patient Details  Name: Matthew Escobar MRN: [id number] Date of Birth: [birth year] No Data Recorded  Encounter Date: 10/03/2015      PT End of Session - 10/03/15 1408    Visit Number 6   Number of Visits 12   Date for PT Re-Evaluation 10/26/15   PT Start Time 7858   PT Stop Time 1450   PT Time Calculation (min) 45 min      History reviewed. No pertinent past medical history.  History reviewed. No pertinent past surgical history.  There were no vitals filed for this visit.  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    Currently in Pain? Yes   Pain Score 2    Pain Location Back   Pain Orientation Right   Pain Descriptors / Indicators Aching  thumping   Pain Type Neuropathic pain   Pain Radiating Towards in his foot a little bit Saturday evening   Pain Onset More than a month ago   Pain Frequency Intermittent   Aggravating Factors  sitting in different car, increased acitivity.    Pain Relieving Factors TENS unit, rest                         [facility] Adult PT Treatment/Exercise - 10/03/15 0001    Lumbar Exercises: Stretches   Lower Trunk Rotation --  10 reps   Prone on Elbows Stretch 60 seconds   Press Ups 5 reps   Lumbar Exercises: Aerobic   Stationary Bike NuStep L5: 5 min    Lumbar Exercises: Supine   Isometric Hip Flexion 10 reps;5 seconds  bilat, same side/opposite sides   Modalities   Modalities Cryotherapy;Electrical Stimulation   Cryotherapy   Number Minutes  Cryotherapy 15 Minutes   Cryotherapy Location Lumbar Spine   Type of Cryotherapy Ice pack   Electrical Stimulation   Electrical Stimulation Location Rt lumbar and buttocks   Electrical Stimulation Action IFC   Electrical Stimulation Parameters to tolerance   Electrical Stimulation Goals Pain   Manual Therapy   Manual Therapy Passive ROM   Joint Mobilization Rt posterior hip mobs - felt into the back, Lumbar CPA mobs grade III   Passive ROM Rt hamstring stretch, piriformis, knee to chest stretch                PT Education - 10/03/15 1425    Education provided Yes   Education Details discussed TDN and benefits   Person(s) Educated Patient   Methods Explanation;Handout   Comprehension Verbalized understanding             PT Long Term Goals - 10/03/15 1515    PT LONG TERM GOAL #1   Title Patient I in HEP at discharge 10/26/15   Status On-going   PT LONG TERM GOAL #2   Title 5/5 strength Rt hip extension/LE 10/26/15   Status On-going   PT LONG TERM GOAL #3   Title Hamstring flexibility 75-80 degress bilat 10/26/15   Status On-going   PT LONG  TERM GOAL #4   Title Increased sitting tolerance to 60 min 10/26/15   Status On-going   PT LONG TERM GOAL #5   Title Improve FOTO to </= 35% limitation 10/26/15   Status On-going               Plan - 10/03/15 1510    Clinical Impression Statement Pt was very sore after the weekend, it has settled down some today.  He will still have intermittent pain into the Rt foot. Has 5 more days on prednisone. He has significant tightness in the Rt lumbar paraspinals.  Discussed TDN, he is not sure he wants to try this. Pt reported increased symptoms in his Rt foot while lying prone and having SWT to Rt lumbar paraspinals. No goals met.     Rehab Potential Good   PT Frequency 2x / week   PT Duration 6 weeks   PT Treatment/Interventions Patient/family education;ADLs/Self Care Home Management;Therapeutic exercise;Therapeutic  activities;Cryotherapy;Electrical Stimulation;Moist Heat;Ultrasound;Dry needling;Manual techniques   PT Next Visit Plan soft tissue work;  progressive strengthening of core and stretching of LE.   Continue extension program. trial of quadraped exercises if pain is under control   Consulted and Agree with Plan of Care Patient        Problem List Patient Active Problem List   Diagnosis Date Noted  . Lumbago of lumbar region with sciatica 09/26/2015    Jeral Pinch PT 10/03/2015, 3:30 PM  [facility] [place], [place], [address] Phone: [phone]   Fax:  [phone]  Name: Matthew Escobar MRN: [id number] Date of Birth: 1956-05-08

## 2015-10-05 ENCOUNTER — Encounter: Payer: Self-pay | Admitting: Rehabilitative and Restorative Service Providers"

## 2015-10-05 ENCOUNTER — Ambulatory Visit (INDEPENDENT_AMBULATORY_CARE_PROVIDER_SITE_OTHER): Payer: 59 | Admitting: Rehabilitative and Restorative Service Providers"

## 2015-10-05 DIAGNOSIS — M2560 Stiffness of unspecified joint, not elsewhere classified: Secondary | ICD-10-CM | POA: Diagnosis not present

## 2015-10-05 DIAGNOSIS — Z7409 Other reduced mobility: Secondary | ICD-10-CM | POA: Diagnosis not present

## 2015-10-05 DIAGNOSIS — R6889 Other general symptoms and signs: Secondary | ICD-10-CM

## 2015-10-05 DIAGNOSIS — M5441 Lumbago with sciatica, right side: Secondary | ICD-10-CM

## 2015-10-05 DIAGNOSIS — R531 Weakness: Secondary | ICD-10-CM

## 2015-10-05 DIAGNOSIS — R29898 Other symptoms and signs involving the musculoskeletal system: Secondary | ICD-10-CM

## 2015-10-05 NOTE — Therapy (Signed)
[facility] [address] [place], [place], [address] Phone: [phone]   Fax:  [phone]  Physical Therapy Treatment  Patient Details  Name: Matthew Escobar MRN: [id number] Date of Birth: October 10, 1956 No Data Recorded  Encounter Date: 10/05/2015      PT End of Session - 10/05/15 1456    Visit Number 7   Number of Visits 12   Date for PT Re-Evaluation 10/26/15   PT Start Time 1405   PT Stop Time 1457   PT Time Calculation (min) 52 min   Activity Tolerance Patient tolerated treatment well      History reviewed. No pertinent past medical history.  History reviewed. No pertinent past surgical history.  There were no vitals filed for this visit.  Visit Diagnosis:  Stiffness in joint  Weakness of right lower extremity  Decreased strength, endurance, and mobility  Bilateral low back pain with right-sided sciatica      Subjective Assessment - 10/05/15 1407    Subjective Some pain the afternoon after he was here last time but felt better the next day.   Currently in Pain? Yes   Pain Score 1    Pain Location Back   Pain Orientation Right   Pain Descriptors / Indicators Dull   Pain Type Neuropathic pain   Pain Onset More than a month ago            [facility] - 10/05/15 0001    Assessment   Medical Diagnosis LBP   Onset Date/Surgical Date 08/31/15   Hand Dominance Left   Next MD Visit January    AROM   Lumbar Flexion 80%   Lumbar Extension 65%   Lumbar - Right Side Bend 60%   Lumbar - Left Side Bend 55%   Lumbar - Right Rotation 30%   Lumbar - Left Rotation 25%   Flexibility   Hamstrings 72 deg Rt; 80 deg   Palpation   Spinal mobility min pain with spring testing L5 - Rt    Palpation comment tightness Rt pelvic crest distal to SI joint; hip abductors                     [facility] Adult PT Treatment/Exercise - 10/05/15 0001    Lumbar Exercises: Stretches   Prone on Elbows  Stretch 60 seconds   Press Ups 5 reps   Lumbar Exercises: Aerobic   Stationary Bike L5 5 min    Lumbar Exercises: Standing   Wall Slides 10 reps;5 seconds   Row Strengthening;Both;20 reps;Theraband   Theraband Level (Row) Level 3 (Green)   Other Standing Lumbar Exercises step up 6 inch step 10 x each leg engaging core   Lumbar Exercises: Supine   Ab Set 10 reps   Bridge 10 reps;5 seconds   Isometric Hip Flexion 5 reps;5 seconds   Other Supine Lumbar Exercises alt arm and leg 10 reps with 3 part core   Modalities   Modalities Cryotherapy;Electrical Stimulation   Cryotherapy   Number Minutes Cryotherapy 15 Minutes   Cryotherapy Location Lumbar Spine;Hip   Type of Cryotherapy Ice pack   Electrical Stimulation   Electrical Stimulation Location Rt L5 area; lumbar paraspinals; Rt hip   Electrical Stimulation Action IFC   Electrical Stimulation Parameters to tolerance   Electrical Stimulation Goals Pain   Ultrasound   Ultrasound Location Rt lumbar pasaspinals    Ultrasound Parameters 1 mHz; i.4 w/cm2; 100% 7 min    Ultrasound Goals Pain;Other (  Comment)  muscular tightness    Manual Therapy   Joint Mobilization lumbar CPA mobs grade 3   Soft tissue mobilization soft tissue work Rt hip/iriformis area decreasede pressure                     PT Long Term Goals - 10/03/15 1515    PT LONG TERM GOAL #1   Title Patient I in HEP at discharge 10/26/15   Status On-going   PT LONG TERM GOAL #2   Title 5/5 strength Rt hip extension/LE 10/26/15   Status On-going   PT LONG TERM GOAL #3   Title Hamstring flexibility 75-80 degress bilat 10/26/15   Status On-going   PT LONG TERM GOAL #4   Title Increased sitting tolerance to 60 min 10/26/15   Status On-going   PT LONG TERM GOAL #5   Title Improve FOTO to </= 35% limitation 10/26/15   Status On-going               Plan - 10/05/15 1456    Clinical Impression Statement Cont ups and downs but making progress overall.  Less pain; improved lumbar ROM/mobility; increased functional activity level. Progressing with core stabilization. Progressing towards stated goals of therapy.    Pt will benefit from skilled therapeutic intervention in order to improve on the following deficits Improper body mechanics;Postural dysfunction;Decreased range of motion;Decreased mobility;Decreased strength;Decreased endurance;Decreased activity tolerance;Pain   Rehab Potential Good   PT Frequency 3x / week   PT Duration 6 weeks   PT Treatment/Interventions Patient/family education;ADLs/Self Care Home Management;Therapeutic exercise;Therapeutic activities;Cryotherapy;Electrical Stimulation;Moist Heat;Ultrasound;Dry needling;Manual techniques   PT Next Visit Plan soft tissue work;  progressive strengthening of core and stretching of LE.   Continue extension program. trial of quadraped exercises if pain is under control   PT Home Exercise Plan positioning; extension program; HEP;  ~1/4 inchheel lift Rt   Consulted and Agree with Plan of Care Patient        Problem List Patient Active Problem List   Diagnosis Date Noted  . Lumbago of lumbar region with sciatica 09/26/2015    Matthew Escobar PT, MPH 10/05/2015, 3:04 PM  [facility] [address] [place], [place], [address] Phone: [phone]   Fax:  [phone]  Name: Matthew Escobar MRN: [id number] Date of Birth: [birth year]

## 2015-10-10 ENCOUNTER — Ambulatory Visit (INDEPENDENT_AMBULATORY_CARE_PROVIDER_SITE_OTHER): Payer: 59 | Admitting: Rehabilitative and Restorative Service Providers"

## 2015-10-10 ENCOUNTER — Encounter: Payer: Self-pay | Admitting: Rehabilitative and Restorative Service Providers"

## 2015-10-10 ENCOUNTER — Encounter: Payer: Self-pay | Admitting: Family Medicine

## 2015-10-10 ENCOUNTER — Ambulatory Visit (INDEPENDENT_AMBULATORY_CARE_PROVIDER_SITE_OTHER): Payer: 59 | Admitting: Family Medicine

## 2015-10-10 VITALS — BP 146/90 | HR 96 | Wt 233.0 lb

## 2015-10-10 DIAGNOSIS — M5440 Lumbago with sciatica, unspecified side: Secondary | ICD-10-CM

## 2015-10-10 DIAGNOSIS — M2560 Stiffness of unspecified joint, not elsewhere classified: Secondary | ICD-10-CM

## 2015-10-10 DIAGNOSIS — M5441 Lumbago with sciatica, right side: Secondary | ICD-10-CM

## 2015-10-10 DIAGNOSIS — R29898 Other symptoms and signs involving the musculoskeletal system: Secondary | ICD-10-CM

## 2015-10-10 DIAGNOSIS — M5430 Sciatica, unspecified side: Secondary | ICD-10-CM

## 2015-10-10 DIAGNOSIS — R6889 Other general symptoms and signs: Secondary | ICD-10-CM

## 2015-10-10 DIAGNOSIS — M545 Low back pain: Secondary | ICD-10-CM

## 2015-10-10 DIAGNOSIS — Z7409 Other reduced mobility: Secondary | ICD-10-CM

## 2015-10-10 DIAGNOSIS — R531 Weakness: Secondary | ICD-10-CM

## 2015-10-10 NOTE — Patient Instructions (Addendum)
Upper / Lower Extremity Extension (All-Fours)    Tighten core and raise right leg and opposite arm.(lift leg with toe slightly off floor, not as the diagram shows) Keep trunk rigid. Repeat __10__ times per set. Do __1-3__ sets per session. Do __2__ sessions per day.  Achilles / Soleus, Standing    Stand, right foot behind, heel on floor and turned slightly out. Lower hips and bend knees. Hold _30__ seconds. Repeat _3__ times per session. Do _3-4__ sessions per day.   Gastroc Stretch    Stand with right foot back, leg straight, forward leg bent. Keeping heel on floor, turned slightly out, lean into wall until stretch is felt in calf. Hold __30__ seconds. Repeat __3__ times per set. Do __3-4__ sessions per day.    Copyright  VHI. All rights reserved.

## 2015-10-10 NOTE — Therapy (Signed)
[facility] [address] [place], [place], [address] Phone: [phone]   Fax:  [phone]  Physical Therapy Treatment  Patient Details  Name: Matthew DuckingJoseph Escobar MRN: [id number] Date of Birth: October 01, 1956 No Data Recorded  Encounter Date: 10/10/2015      PT End of Session - 10/10/15 1408    Visit Number 8   Number of Visits 12   Date for PT Re-Evaluation 10/26/15   PT Start Time 1401   PT Stop Time 1450   PT Time Calculation (min) 49 min   Activity Tolerance Patient tolerated treatment well      History reviewed. No pertinent past medical history.  History reviewed. No pertinent past surgical history.  There were no vitals filed for this visit.  Visit Diagnosis:  Stiffness in joint  Weakness of right lower extremity  Decreased strength, endurance, and mobility  Bilateral low back pain with right-sided sciatica      Subjective Assessment - 10/10/15 1404    Subjective Increased pain tday = not sure of any reason he is having increased pain. Saw Dr. Denyse Amassorey today and will wait 2-4 weeks to see him again. He will call to schedule in 2 weeks if symptoms persist and in 4 weeks if he is improving. Matthew Escobar reports that he continues to have pain in an intermittent basis. The spasms are better - none in at least 2 weeks. Still has the numnbess in the bottom of the Rt foot and pain in the Rt hip area.    Currently in Pain? Yes   Pain Score 3    Pain Location Back   Pain Orientation Right   Pain Descriptors / Indicators Dull                         [facility] Adult PT Treatment/Exercise - 10/10/15 0001    Therapeutic Activites    Therapeutic Activities --  gastroc/soleus streetches for plantar fasciitis Lt foot    Lumbar Exercises: Stretches   Prone on Elbows Stretch 60 seconds   Press Ups 5 reps   Lumbar Exercises: Aerobic   Stationary Bike L5 5.5 min    Lumbar Exercises: Standing   Wall Slides 10 reps;5  seconds   Lumbar Exercises: Quadruped   Single Arm Raise 5 reps   Straight Leg Raise 5 reps   Opposite Arm/Leg Raise 10 reps   Modalities   Modalities Cryotherapy;Electrical Stimulation   Cryotherapy   Number Minutes Cryotherapy 15 Minutes   Cryotherapy Location Lumbar Spine;Hip   Type of Cryotherapy Ice pack   Electrical Stimulation   Electrical Stimulation Location Rt L5 area; lumbar paraspinals; Rt hip   Electrical Stimulation Action IFC   Electrical Stimulation Parameters to tolerance   Electrical Stimulation Goals Pain   Manual Therapy   Joint Mobilization lumbar CPA mobs grade 3   Soft tissue mobilization soft tissue work Rt hip/iriformis    Myofascial Release lumbar to Rt hip/buttocks                PT Education - 10/10/15 1416    Education provided Yes   Education Details Added quadraped lumbar stabillization   Person(s) Educated Patient   Methods Explanation;Demonstration;Tactile cues;Verbal cues;Handout   Comprehension Verbalized understanding;Returned demonstration;Verbal cues required;Tactile cues required             PT Long Term Goals - 10/03/15 1515    PT LONG TERM GOAL #1   Title Patient I in HEP  at discharge 10/26/15   Status On-going   PT LONG TERM GOAL #2   Title 5/5 strength Rt hip extension/LE 10/26/15   Status On-going   PT LONG TERM GOAL #3   Title Hamstring flexibility 75-80 degress bilat 10/26/15   Status On-going   PT LONG TERM GOAL #4   Title Increased sitting tolerance to 60 min 10/26/15   Status On-going   PT LONG TERM GOAL #5   Title Improve FOTO to </= 35% limitation 10/26/15   Status On-going               Plan - 10/10/15 1740    Clinical Impression Statement Patient reports progress with PT and HEP but he continues to have flare ups of pain in the Rt hip and numbness persists in the Rt foot. The spasms have resolved with no spasm in the LB in over 2 weeks. He progressed with quadraped core stailization today. Pt  would like to try the dry needling before RTD. Gradual progress continues toward stated goals of therapy. Pt reports flare up of recurrent plantar fasciitis Lt foot which was addressed today with instruction in stretches for home.    Pt will benefit from skilled therapeutic intervention in order to improve on the following deficits Improper body mechanics;Postural dysfunction;Decreased range of motion;Decreased mobility;Decreased strength;Decreased endurance;Decreased activity tolerance;Pain   Rehab Potential Good   PT Frequency 2x / week   PT Duration 6 weeks   PT Treatment/Interventions Patient/family education;ADLs/Self Care Home Management;Therapeutic exercise;Therapeutic activities;Cryotherapy;Electrical Stimulation;Moist Heat;Ultrasound;Dry needling;Manual techniques   PT Next Visit Plan soft tissue work;  progressive strengthening of core and stretching of LE.   Continue extension program. trial of dry needling   PT Home Exercise Plan positioning; extension program; HEP;  ~1/4 inchheel lift Rt; added stretches for Lt calf to address plantar fasciitis    Consulted and Agree with Plan of Care Patient        Problem List Patient Active Problem List   Diagnosis Date Noted  . Lumbago of lumbar region with sciatica 09/26/2015    Matthew Escobar [name] PT, MPH 10/10/2015, 5:45 PM  [facility] [address] [place], [place], [address] Phone: [phone]   Fax:  [phone]  Name: Matthew Escobar MRN: [id number] Date of Birth: Jan 31, 1956

## 2015-10-10 NOTE — Progress Notes (Signed)
Matthew DuckingJoseph Escobar is a 59 y.o. male who [facility]  today for follow-up for back pain. Patient was seen approximately 2 weeks ago for lumbago with sciatica to the right leg. In the interim he's had for visits with physical therapy and feels as though he is improving. He notes continued intermittent sciatica and lumbago but overall feels improved. No weakness or numbness bowel bladder dysfunction or difficulty walking. No fevers or chills nausea vomiting or diarrhea.   No past medical history on file. No past surgical history on file. Social History  Substance Use Topics  . Smoking status: Current Every Day Smoker  . Smokeless tobacco: Not on file  . Alcohol Use: Not on file   family history is not on file.  ROS as above Medications: Current Outpatient Prescriptions  Medication Sig Dispense Refill  . atorvastatin (LIPITOR) 10 MG tablet Take 10 mg by mouth.    Marland Kitchen. lisinopril (PRINIVIL,ZESTRIL) 5 MG tablet TAKE ONE (1) TABLET BY MOUTH EVERY DAY  11  . LORazepam (ATIVAN) 1 MG tablet TAKE ONE TABLET BY MOUTH TWICE EVERY DAY -- AS NEEDED  0   No current facility-administered medications for this visit.   No Known Allergies   Exam:  BP 146/90 mmHg  Pulse 96  Wt 233 lb (105.688 kg) Gen: Well NAD Back: Nontender. Slightly decreased motion. Normal gait. Lower extremities strength is intact as is sensation.  No results found for this or any previous visit (from the past 24 hour(s)). No results found.   Please see individual assessment and plan sections.

## 2015-10-10 NOTE — Patient Instructions (Signed)
Thank you for coming in today. Call me in a week or two if not better and we will order a MRI.  Otherwise I will see you in 1 month.   Come back or go to the emergency room if you notice new weakness new numbness problems walking or bowel or bladder problems.

## 2015-10-10 NOTE — Assessment & Plan Note (Signed)
Improving. Continued physical therapy. Recheck 1 month. MRI of the interim with plateauing or not improving.

## 2015-10-12 ENCOUNTER — Encounter: Payer: 59 | Admitting: Rehabilitative and Restorative Service Providers"

## 2015-10-12 ENCOUNTER — Encounter: Payer: 59 | Admitting: Physical Therapy

## 2015-10-13 ENCOUNTER — Encounter: Payer: Self-pay | Admitting: Physical Therapy

## 2015-10-13 ENCOUNTER — Ambulatory Visit (INDEPENDENT_AMBULATORY_CARE_PROVIDER_SITE_OTHER): Payer: 59 | Admitting: Physical Therapy

## 2015-10-13 DIAGNOSIS — Z7409 Other reduced mobility: Secondary | ICD-10-CM | POA: Diagnosis not present

## 2015-10-13 DIAGNOSIS — M2560 Stiffness of unspecified joint, not elsewhere classified: Secondary | ICD-10-CM | POA: Diagnosis not present

## 2015-10-13 DIAGNOSIS — R531 Weakness: Secondary | ICD-10-CM

## 2015-10-13 DIAGNOSIS — M5441 Lumbago with sciatica, right side: Secondary | ICD-10-CM

## 2015-10-13 DIAGNOSIS — R29898 Other symptoms and signs involving the musculoskeletal system: Secondary | ICD-10-CM | POA: Diagnosis not present

## 2015-10-13 DIAGNOSIS — R6889 Other general symptoms and signs: Secondary | ICD-10-CM

## 2015-10-13 NOTE — Therapy (Addendum)
[facility] [place], [place], [address] Phone: [phone]   Fax:  [phone]  Physical Therapy Treatment  Patient Details  Name: Matthew Escobar MRN: [id number] Date of Birth: [birth year] No Data Recorded  Encounter Date: 10/13/2015      PT End of Session - 10/13/15 1451    Visit Number 9   Number of Visits 12   Date for PT Re-Evaluation 10/26/15   PT Start Time 3244   PT Stop Time 1555   PT Time Calculation (min) 67 min   Activity Tolerance Patient tolerated treatment well      History reviewed. No pertinent past medical history.  History reviewed. No pertinent past surgical history.  There were no vitals filed for this visit.  Visit Diagnosis:  Stiffness in joint  Weakness of right lower extremity  Decreased strength, endurance, and mobility  Bilateral low back pain with right-sided sciatica      Subjective Assessment - 10/13/15 1449    Subjective Pt reports he has been in so much pain since he was here on Tuesday that he hasn't been able to do his HEP, can only bend to the left. It does loosen up with walking.    Currently in Pain? Yes   Pain Score 5    Pain Location Back   Pain Orientation Right   Pain Radiating Towards into Rt foot tingling                          [facility] Adult PT Treatment/Exercise - 10/13/15 0001    Lumbar Exercises: Stretches   Single Knee to Chest Stretch 2 reps;20 seconds  each side   Lower Trunk Rotation --  10reps   Prone on Elbows Stretch 60 seconds   Lumbar Exercises: Supine   Other Supine Lumbar Exercises ball releases in Rt QL and SIJ    Lumbar Exercises: Quadruped   [place]cat/Old Horse 10 reps  VC for form   Modalities   Modalities Electrical Stimulation;Moist Heat   Moist Heat Therapy   Number Minutes Moist Heat 15 Minutes   Moist Heat Location Lumbar Spine  Rt and buttocks   Electrical Stimulation   Electrical Stimulation  Location Rt lumbar and buttocks   Electrical Stimulation Action IFC   Electrical Stimulation Parameters to tolerance   Electrical Stimulation Goals Pain   Manual Therapy   Manual Therapy Soft tissue mobilization;Joint mobilization   Joint Mobilization grade II lumbar PA mobs   Soft tissue mobilization to Rt lumbar multifidi, gluts and piriformisn          Trigger Point Dry Needling - 10/13/15 1521    Consent Given? Yes   Education Handout Provided Yes  good twitch response in multifidi, fair in QL   Muscles Treated Upper Body Quadratus Lumborum  Rt and upper lumbar multifidis L1-3                   PT Long Term Goals - 10/03/15 1515    PT LONG TERM GOAL #1   Title Patient I in HEP at discharge 10/26/15   Status On-going   PT LONG TERM GOAL #2   Title 5/5 strength Rt hip extension/LE 10/26/15   Status On-going   PT LONG TERM GOAL #3   Title Hamstring flexibility 75-80 degress bilat 10/26/15   Status On-going   PT LONG TERM GOAL #4   Title Increased sitting tolerance to 60 min 10/26/15  Status On-going   PT LONG TERM GOAL #5   Title Improve FOTO to </= 35% limitation 10/26/15   Status On-going               Plan - 10/13/15 1547    Clinical Impression Statement Pt with some good muscle releases with treatement today. demo'd about 2 more inches of Rt side bend before he was limited by pain after tx.  No new goals met.  He will be out of town next week and will call to schedule when he returns.    Pt will benefit from skilled therapeutic intervention in order to improve on the following deficits Improper body mechanics;Postural dysfunction;Decreased range of motion;Decreased mobility;Decreased strength;Decreased endurance;Decreased activity tolerance;Pain   Rehab Potential Good   PT Frequency 2x / week   PT Duration 6 weeks   PT Treatment/Interventions Patient/family education;ADLs/Self Care Home Management;Therapeutic exercise;Therapeutic  activities;Cryotherapy;Electrical Stimulation;Moist Heat;Ultrasound;Dry needling;Manual techniques   PT Next Visit Plan TDN as needed and try with stim.  once he returns from vacation.    Consulted and Agree with Plan of Care Patient        Problem List Patient Active Problem List   Diagnosis Date Noted  . Lumbago of lumbar region with sciatica 09/26/2015    Jeral Pinch PT 10/13/2015, 3:50 PM  [facility] [place], [place], [address] Phone: [phone]   Fax:  [phone]  Name: Matthew Escobar MRN: [id number] Date of Birth: 1956/04/02    PHYSICAL THERAPY DISCHARGE SUMMARY Visits from Start of Care: 9  Current functional level related to goals / functional outcomes: unknown   Remaining deficits: unknown   Education / Equipment: unknown Plan:                                                    Patient goals were not met. Patient is being discharged due to not returning since the last visit. Patient was going on vacation, was to call and schedule when he returned.  We have not heard from him.   ?????        Jeral Pinch, PT 11/14/2015 4:05 PM

## 2015-11-07 ENCOUNTER — Encounter: Payer: Self-pay | Admitting: Family Medicine

## 2015-11-07 ENCOUNTER — Ambulatory Visit (INDEPENDENT_AMBULATORY_CARE_PROVIDER_SITE_OTHER): Payer: 59 | Admitting: Family Medicine

## 2015-11-07 VITALS — BP 156/94 | HR 86 | Wt 241.0 lb

## 2015-11-07 DIAGNOSIS — M5430 Sciatica, unspecified side: Secondary | ICD-10-CM

## 2015-11-07 DIAGNOSIS — M545 Low back pain: Secondary | ICD-10-CM | POA: Diagnosis not present

## 2015-11-07 DIAGNOSIS — M5440 Lumbago with sciatica, unspecified side: Secondary | ICD-10-CM

## 2015-11-07 NOTE — Assessment & Plan Note (Signed)
Improved but not resolved. Discussed options. Plan for MRI of L-spine. Return to clinic following MRI.  Blood pressure continues to be elevated. Recommend patient follow-up with PCP.  Patient has been exposed to ankle grinding in the past. We'll obtain x-ray of orbits for prep for MRI.

## 2015-11-07 NOTE — Patient Instructions (Signed)
Thank you for coming in today. Follow up with your doctor about the blood pressure.  Get MRI back.  Follow up in a few days after the MRI to discuss findings.

## 2015-11-07 NOTE — Progress Notes (Signed)
Matthew DuckingJoseph Escobar is a 59 y.o. male who [facility]  today for follow-up lumbago and sciatica. Patient was seen several times for right-sided low back pain with radicular pain to the right lateral foot. He's been treated with conservative measures including physical therapy. He is improved but is not pain-free. Occasionally he notes continued radicular pain. He does note paresthesias to the right lateral foot. No weakness or numbness bowel bladder dysfunction.   No past medical history on file. No past surgical history on file. Social History  Substance Use Topics  . Smoking status: Current Every Day Smoker  . Smokeless tobacco: Not on file  . Alcohol Use: Not on file   family history is not on file.  ROS as above Medications: Current Outpatient Prescriptions  Medication Sig Dispense Refill  . atorvastatin (LIPITOR) 10 MG tablet Take 10 mg by mouth.    Marland Kitchen. lisinopril (PRINIVIL,ZESTRIL) 5 MG tablet TAKE ONE (1) TABLET BY MOUTH EVERY DAY  11  . LORazepam (ATIVAN) 1 MG tablet TAKE ONE TABLET BY MOUTH TWICE EVERY DAY -- AS NEEDED  0   No current facility-administered medications for this visit.   No Known Allergies   Exam:  BP 156/94 mmHg  Pulse 86  Wt 241 lb (109.317 kg) Gen: Well NAD Back is nontender to spinal midline. Mildly tender to pain right lumbar paraspinals. Lower extremity strength is normal. Gait is normal. Sensation is intact.   X-ray lumbar spine reviewed   No results found for this or any previous visit (from the past 24 hour(s)). No results found.   Please see individual assessment and plan sections.

## 2015-11-14 ENCOUNTER — Ambulatory Visit (INDEPENDENT_AMBULATORY_CARE_PROVIDER_SITE_OTHER): Payer: 59

## 2015-11-14 DIAGNOSIS — Z0000 Encounter for general adult medical examination without abnormal findings: Secondary | ICD-10-CM

## 2015-11-14 DIAGNOSIS — M5136 Other intervertebral disc degeneration, lumbar region: Secondary | ICD-10-CM | POA: Diagnosis not present

## 2015-11-14 DIAGNOSIS — M5126 Other intervertebral disc displacement, lumbar region: Secondary | ICD-10-CM

## 2015-11-14 DIAGNOSIS — M4806 Spinal stenosis, lumbar region: Secondary | ICD-10-CM

## 2015-11-14 DIAGNOSIS — M5440 Lumbago with sciatica, unspecified side: Secondary | ICD-10-CM

## 2015-11-15 NOTE — Progress Notes (Signed)
Quick Note:  Bulging discs seen at several levels worse at L5-S1. The radiating pain can be treated with a special xray guided injection if Mr Matthew Escobar feels that the pain is worsening or not improved enough. ______

## 2015-11-17 ENCOUNTER — Ambulatory Visit (INDEPENDENT_AMBULATORY_CARE_PROVIDER_SITE_OTHER): Payer: 59 | Admitting: Family Medicine

## 2015-11-17 ENCOUNTER — Encounter: Payer: Self-pay | Admitting: Family Medicine

## 2015-11-17 VITALS — BP 158/84 | HR 80 | Wt 238.0 lb

## 2015-11-17 DIAGNOSIS — M5416 Radiculopathy, lumbar region: Secondary | ICD-10-CM

## 2015-11-17 NOTE — Assessment & Plan Note (Signed)
Persistent despite conservative measures however somewhat improved. Epidural steroid injection discussed and ordered. Follow-up following epidural steroid as needed.

## 2015-11-17 NOTE — Patient Instructions (Signed)
Thank you for coming in today. Come back or go to the emergency room if you notice new weakness new numbness problems walking or bowel or bladder problems. Expect to hear from the radiologists.

## 2015-11-17 NOTE — Progress Notes (Signed)
Called and left message to schedule

## 2015-11-17 NOTE — Progress Notes (Signed)
Matthew DuckingJoseph Escobar is a 59 y.o. male who presents [facility]  today for follow-up lumbar radiculopathy. Patient is here to follow-up and discuss the results of his lumbar MRI. He continues to have mild to moderate right-sided lumbar radiculopathy. He denies any weakness or numbness or loss of function. Symptoms persist. No bowel or bladder dysfunction Patient is interested in lumbar epidural steroid injection  No past medical history on file. No past surgical history on file. Social History  Substance Use Topics  . Smoking status: Current Every Day Smoker  . Smokeless tobacco: Not on file  . Alcohol Use: Not on file   family history is not on file.  ROS as above Medications: Current Outpatient Prescriptions  Medication Sig Dispense Refill  . atorvastatin (LIPITOR) 10 MG tablet Take 10 mg by mouth.    Marland Kitchen. lisinopril (PRINIVIL,ZESTRIL) 5 MG tablet TAKE ONE (1) TABLET BY MOUTH EVERY DAY  11  . LORazepam (ATIVAN) 1 MG tablet TAKE ONE TABLET BY MOUTH TWICE EVERY DAY -- AS NEEDED  0   No current facility-administered medications for this visit.   No Known Allergies   Exam:  BP 158/84 mmHg  Pulse 80  Wt 238 lb (107.956 kg) Gen: Well NAD Back: Normal gait.  MRI L-spine reviewed  No results found for this or any previous visit (from the past 24 hour(s)). No results found.   Please see individual assessment and plan sections.

## 2015-11-23 ENCOUNTER — Ambulatory Visit
Admission: RE | Admit: 2015-11-23 | Discharge: 2015-11-23 | Disposition: A | Discharge status: Home / Self Care | Payer: 59 | Source: Ambulatory Visit | Attending: Family Medicine | Admitting: Family Medicine

## 2015-11-23 VITALS — BP 166/92 | HR 72

## 2015-11-23 DIAGNOSIS — M5416 Radiculopathy, lumbar region: Secondary | ICD-10-CM

## 2015-11-23 MED ORDER — METHYLPREDNISOLONE ACETATE 40 MG/ML INJ SUSP (RADIOLOG
120.0000 mg | Freq: Once | INTRAMUSCULAR | Status: AC
  Administered 2015-11-23: 120 mg via EPIDURAL

## 2015-11-23 MED ORDER — IOHEXOL 180 MG/ML  SOLN
1.0000 mL | Freq: Once | INTRAMUSCULAR | Status: AC | PRN
  Administered 2015-11-23: 1 mL via EPIDURAL

## 2015-11-23 NOTE — Discharge Instructions (Signed)

## 2015-12-02 ENCOUNTER — Telehealth: Payer: Self-pay | Admitting: Family Medicine

## 2015-12-02 DIAGNOSIS — M5431 Sciatica, right side: Secondary | ICD-10-CM

## 2015-12-02 NOTE — Telephone Encounter (Signed)
[facility] and request to speak with a nurse- Thanks

## 2015-12-02 NOTE — Telephone Encounter (Signed)
Left message with [facility] to schedule.

## 2015-12-05 ENCOUNTER — Telehealth: Payer: Self-pay

## 2015-12-05 MED ORDER — TRAMADOL HCL 50 MG PO TABS: 50.0000 mg | ORAL_TABLET | Freq: Three times a day (TID) | ORAL | Status: DC | PRN

## 2015-12-05 NOTE — Telephone Encounter (Signed)
Tramadol refilled and will fax to [facility]

## 2015-12-05 NOTE — Telephone Encounter (Signed)
Patient wants a refill on Tramadol. The tramadol is not on current medication list. Is refill appropriate?

## 2015-12-05 NOTE — Telephone Encounter (Signed)
rx faxed

## 2015-12-09 ENCOUNTER — Ambulatory Visit
Admission: RE | Admit: 2015-12-09 | Discharge: 2015-12-09 | Disposition: A | Discharge status: Home / Self Care | Payer: 59 | Source: Ambulatory Visit | Attending: Family Medicine | Admitting: Family Medicine

## 2015-12-09 VITALS — BP 154/90 | HR 72

## 2015-12-09 DIAGNOSIS — M5416 Radiculopathy, lumbar region: Secondary | ICD-10-CM

## 2015-12-09 MED ORDER — IOHEXOL 180 MG/ML  SOLN: 1.0000 mL | Freq: Once | INTRAMUSCULAR | Status: DC | PRN

## 2015-12-09 MED ORDER — METHYLPREDNISOLONE ACETATE 40 MG/ML INJ SUSP (RADIOLOG: 120.0000 mg | Freq: Once | INTRAMUSCULAR | Status: DC

## 2015-12-09 NOTE — Discharge Instructions (Signed)

## 2017-03-08 ENCOUNTER — Other Ambulatory Visit: Payer: Self-pay | Admitting: Internal Medicine

## 2017-03-08 DIAGNOSIS — Z87891 Personal history of nicotine dependence: Secondary | ICD-10-CM

## 2017-03-14 ENCOUNTER — Ambulatory Visit
Admission: RE | Admit: 2017-03-14 | Discharge: 2017-03-14 | Disposition: A | Discharge status: Home / Self Care | Payer: 59 | Source: Ambulatory Visit | Attending: Internal Medicine | Admitting: Internal Medicine

## 2017-03-14 DIAGNOSIS — Z87891 Personal history of nicotine dependence: Secondary | ICD-10-CM

## 2017-03-18 ENCOUNTER — Ambulatory Visit (INDEPENDENT_AMBULATORY_CARE_PROVIDER_SITE_OTHER): Payer: 59 | Admitting: Family Medicine

## 2017-03-18 DIAGNOSIS — S39012A Strain of muscle, fascia and tendon of lower back, initial encounter: Secondary | ICD-10-CM | POA: Insufficient documentation

## 2017-03-18 MED ORDER — KETOROLAC TROMETHAMINE 60 MG/2ML IM SOLN
60.0000 mg | Freq: Once | INTRAMUSCULAR | Status: AC
  Administered 2017-03-18: 60 mg via INTRAMUSCULAR

## 2017-03-18 MED ORDER — CYCLOBENZAPRINE HCL 10 MG PO TABS: 10.0000 mg | ORAL_TABLET | Freq: Three times a day (TID) | ORAL | 0 refills | Status: DC | PRN

## 2017-03-18 MED ORDER — METHYLPREDNISOLONE ACETATE 80 MG/ML IJ SUSP
80.0000 mg | Freq: Once | INTRAMUSCULAR | Status: AC
  Administered 2017-03-18: 80 mg via INTRAMUSCULAR

## 2017-03-18 NOTE — Progress Notes (Signed)
Matthew Escobar is a 61 y.o. male who presents to [facility] today for back pain. Patient was in his normal state of health until a few days ago when he was gardening. At the end of the day he felt pain in his low back. He notes moderate to severe pain located in his right lower back. The pain does not radiate nor is it associated weakness or numbness or bladder dysfunction. No fevers or chills nausea vomiting or diarrhea. He's tried some over-the-counter medicines for pain control which have not helped. The current pain is not consistent with previous episodes of lumbar radiculopathy.   No past medical history on file. No past surgical history on file. Social History  Substance Use Topics  . Smoking status: Current Every Day Smoker  . Smokeless tobacco: Not on file  . Alcohol use Not on file     ROS:  As above   Medications: Current Outpatient Prescriptions  Medication Sig Dispense Refill  . atorvastatin (LIPITOR) 10 MG tablet Take 10 mg by mouth.    Marland Kitchen lisinopril (PRINIVIL,ZESTRIL) 5 MG tablet TAKE ONE (1) TABLET BY MOUTH EVERY DAY  11  . LORazepam (ATIVAN) 1 MG tablet TAKE ONE TABLET BY MOUTH TWICE EVERY DAY -- AS NEEDED  0  . cyclobenzaprine (FLEXERIL) 10 MG tablet Take 1 tablet (10 mg total) by mouth 3 (three) times daily as needed for muscle spasms. 30 tablet 0   No current facility-administered medications for this visit.    No Known Allergies   Exam:  BP 128/77   Pulse 87   Wt 245 lb (111.1 kg)  General: Well Developed, well nourished, and in no acute distress.  Neuro/Psych: Alert and oriented x3, extra-ocular muscles intact, able to move all 4 extremities, sensation grossly intact. Skin: Warm and dry, no rashes noted.  Respiratory: Not using accessory muscles, speaking in full sentences, trachea midline.  Cardiovascular: Pulses palpable, no extremity edema. Abdomen: Does not appear distended. MSK: Nontender to spinal  midline. Tender palpation right SI joint. Normal back flexion rotation and lateral flexion. Diminished extension due to pain. Lower extremity strength is equal and normal throughout. Normal sensation and reflexes. Negative slump test bilaterally. Antalgic gait present.  Study Result   CLINICAL DATA:  Lifting injury 3 months ago. Low back pain with right buttock and leg pain, numbness and weakness. Initial encounter.  EXAM: MRI LUMBAR SPINE WITHOUT CONTRAST  TECHNIQUE: Multiplanar, multisequence MR imaging of the lumbar spine was performed. No intravenous contrast was administered.  COMPARISON:  Abdominal pelvic CT 11/29/2009. Lumbar spine radiographs 09/07/2015  FINDINGS: Segmentation: Conventional anatomy assumed, with the last open disc space designated L5-S1.  Alignment:  Normal.  Bones: No worrisome osseous lesion, acute fracture or pars defect. 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  Conus medullaris: Extends to the L1-2 level and appears normal.  Paraspinal and other soft tissues: No significant paraspinal findings.  Disc levels:  Sagittal images demonstrate mild disc bulging at T11-12. There is no cord deformity or foraminal compromise.  No significant disc space findings at T12-L1 or L1-2.  L2-3: Mild disc bulging. No spinal stenosis or nerve root encroachment.  L3-4: Disc degeneration with bulging and shallow protrusion within the right subarticular zone. This contacts the right L4 nerve root, although does not displace it. No foraminal compromise.  L4-5: Loss of disc height with annular disc bulging and mild facet/ ligamentous hypertrophy. No significant spinal stenosis or  nerve root encroachment.  L5-S1: Disc degeneration with loss of height and annular disc bulging. There is moderate facet and ligamentous hypertrophy contributing to moderate foraminal narrowing bilaterally. The spinal canal and  lateral recesses are widely patent.  IMPRESSION: 1. Moderate foraminal narrowing bilaterally at L5-S1 due to disc bulging, facet and ligamentous hypertrophy. There is possible encroachment on either L5 nerve root. 2. Mild disc bulging and facet hypertrophy at L4-5 without resulting spinal stenosis or nerve root encroachment. 3. Shallow right sided disc protrusion in the subarticular zone at L3-4, touching the right L4 nerve root.   Electronically Signed   By: Carey Bullocks M.D.   On: 11/14/2015 16:18       No results found for this or any previous visit (from the past 48 hour(s)). No results found.    Assessment and Plan: 61 y.o. male with myofascial strain of the right low back. Plan to treat with referral to physical therapy cyclobenzaprine TENS unit heat pad and relative rest. Recheck if not improved.    Orders Placed This Encounter  Procedures  . Ambulatory referral to Physical Therapy    Referral Priority:   Routine    Referral Type:   Physical Medicine    Referral Reason:   Specialty Services Required    Requested Specialty:   Physical Therapy    Number of Visits Requested:   1    Discussed warning signs or symptoms. Please see discharge instructions. Patient expresses understanding.  CC: Ralene Ok, MD

## 2017-03-18 NOTE — Patient Instructions (Signed)
Thank you for coming in today. Attend PT.  Use a heating pad and a muscle relaxer.  Come back or go to the emergency room if you notice new weakness new numbness problems walking or bowel or bladder problems. Recheck in 4-6 weeks or sooner if needed.    Lumbosacral Strain Lumbosacral strain is an injury that causes pain in the lower back (lumbosacral spine). This injury usually occurs from overstretching the muscles or ligaments along your spine. A strain can affect one or more muscles or cord-like tissues that connect bones to other bones (ligaments). What are the causes? This condition may be caused by:  A hard, direct hit (blow) to the back.  Excessive stretching of the lower back muscles. This may result from:  A fall.  Lifting something heavy.  Repetitive movements such as bending or crouching. What increases the risk? The following factors may increase your risk of getting this condition:  Participating in sports or activities that involve:  A sudden twist of the back.  Pushing or pulling motions.  Being overweight or obese.  Having poor strength and flexibility, especially tight hamstrings or weak muscles in the back or abdomen.  Having too much of a curve in the lower back.  Having a pelvis that is tilted forward. What are the signs or symptoms? The main symptom of this condition is pain in the lower back, at the site of the strain. Pain may extend (radiate) down one or both legs. How is this diagnosed? This condition is diagnosed based on:  Your symptoms.  Your medical history.  A physical exam.  Your health care provider may push on certain areas of your back to determine the source of your pain.  You may be asked to bend forward, backward, and side to side to assess the severity of your pain and your range of motion.  Imaging tests, such as:  X-rays.  MRI. How is this treated? Treatment for this condition may include:  Putting heat and cold on  the affected area.  Medicines to help relieve pain and relax your muscles (muscle relaxants).  NSAIDs to help reduce swelling and discomfort. When your symptoms improve, it is important to gradually return to your normal routine as soon as possible to reduce pain, avoid stiffness, and avoid loss of muscle strength. Generally, symptoms should improve within 6 weeks of treatment. However, recovery time varies. Follow these instructions at home: Managing pain, stiffness, and swelling    If directed, put ice on the injured area during the first 24 hours after your strain.  Put ice in a plastic bag.  Place a towel between your skin and the bag.  Leave the ice on for 20 minutes, 2-3 times a day.  If directed, put heat on the affected area as often as told by your health care provider. Use the heat source that your health care provider recommends, such as a moist heat pack or a heating pad.  Place a towel between your skin and the heat source.  Leave the heat on for 20-30 minutes.  Remove the heat if your skin turns bright red. This is especially important if you are unable to feel pain, heat, or cold. You may have a greater risk of getting burned. Activity   Rest and return to your normal activities as told by your health care provider. Ask your health care provider what activities are safe for you.  Avoid activities that take a lot of energy for as long as told  by your health care provider. General instructions   Take over-the-counter and prescription medicines only as told by your health care provider.  Donot drive or use heavy machinery while taking prescription pain medicine.  Do not use any products that contain nicotine or tobacco, such as cigarettes and e-cigarettes. If you need help quitting, ask your health care provider.  Keep all follow-up visits as told by your health care provider. This is important. How is this prevented?  Use correct form when playing sports and  lifting heavy objects.  Use good posture when sitting and standing.  Maintain a healthy weight.  Sleep on a mattress with medium firmness to support your back.  Be safe and responsible while being active to avoid falls.  Do at least 150 minutes of moderate-intensity exercise each week, such as brisk walking or water aerobics. Try a form of exercise that takes stress off your back, such as swimming or stationary cycling.  Maintain physical fitness, including:  Strength.  Flexibility.  Cardiovascular fitness.  Endurance. Contact a health care provider if:  Your back pain does not improve after 6 weeks of treatment.  Your symptoms get worse. Get help right away if:  Your back pain is severe.  You cannot stand or walk.  You have difficulty controlling when you urinate or when you have a bowel movement.  You feel nauseous or you vomit.  Your feet get very cold.  You have numbness, tingling, weakness, or problems using your arms or legs.  You develop any of the following:  Shortness of breath.  Dizziness.  Pain in your legs.  Weakness in your buttocks or legs.  Discoloration of the skin on your toes or legs. This information is not intended to replace advice given to you by your health care provider. Make sure you discuss any questions you have with your health care provider. Document Released: 09/12/2005 Document Revised: 06/22/2016 Document Reviewed: 05/06/2016 Elsevier Interactive Patient Education  2017 [employer].

## 2017-03-20 NOTE — Progress Notes (Signed)
Note sent to requested recipient via epic.  

## 2017-03-25 ENCOUNTER — Other Ambulatory Visit (HOSPITAL_COMMUNITY): Payer: Self-pay | Admitting: Respiratory Therapy

## 2017-03-25 DIAGNOSIS — R05 Cough: Secondary | ICD-10-CM

## 2017-03-25 DIAGNOSIS — R059 Cough, unspecified: Secondary | ICD-10-CM

## 2017-04-05 ENCOUNTER — Encounter (HOSPITAL_COMMUNITY): Payer: 59

## 2017-04-15 ENCOUNTER — Ambulatory Visit: Payer: 59 | Admitting: Family Medicine

## 2018-01-16 ENCOUNTER — Other Ambulatory Visit: Payer: Self-pay

## 2018-01-16 ENCOUNTER — Emergency Department (INDEPENDENT_AMBULATORY_CARE_PROVIDER_SITE_OTHER)
Admission: EM | Admit: 2018-01-16 | Discharge: 2018-01-16 | Disposition: A | Discharge status: Home / Self Care | Payer: 59 | Source: Home / Self Care | Attending: Family Medicine | Admitting: Family Medicine

## 2018-01-16 DIAGNOSIS — S61412A Laceration without foreign body of left hand, initial encounter: Secondary | ICD-10-CM | POA: Diagnosis not present

## 2018-01-16 MED ORDER — TETANUS-DIPHTH-ACELL PERTUSSIS 5-2.5-18.5 LF-MCG/0.5 IM SUSP
0.5000 mL | Freq: Once | INTRAMUSCULAR | Status: AC
  Administered 2018-01-16: 0.5 mL via INTRAMUSCULAR

## 2018-01-16 NOTE — ED Provider Notes (Signed)
[facility]    CSN: [id number] Arrival date & time: 01/16/18  0810     History   Chief Complaint Chief Complaint  Patient presents with  . Laceration    HPI Matthew Escobar is a 62 y.o. male.   HPI Matthew Escobar is a 62 y.o. male presenting to UC with c/o laceration on the back of his Left hand after accidentally cutting it on a piece of metal while trying to install an [facility].  Injury occurred around 7:50AM this morning. Bleeding controlled PTA.  He is not on blood thinners. He does state the cut looked deep when it first happened.  Denies pain at this time, states it is somewhat numb.  He is Left hand dominant.    History reviewed. No pertinent past medical history.  Patient Active Problem List   Diagnosis Date Noted  . Lumbosacral strain 03/18/2017    History reviewed. No pertinent surgical history.     Home Medications    Prior to Admission medications   Medication Sig Start Date End Date Taking? Authorizing Provider  atorvastatin (LIPITOR) 10 MG tablet Take 10 mg by mouth.    [provider]  cyclobenzaprine (FLEXERIL) 10 MG tablet Take 1 tablet (10 mg total) by mouth 3 (three) times daily as needed for muscle spasms. 03/18/17   Rodolph Bongorey, Evan S, MD  lisinopril (PRINIVIL,ZESTRIL) 10 MG tablet TAKE ONE (1) TABLET BY MOUTH EVERY DAY 09/20/15   [provider]  LORazepam (ATIVAN) 1 MG tablet TAKE ONE TABLET BY MOUTH TWICE EVERY DAY -- AS NEEDED 09/12/15   [provider]    Family History History reviewed. No pertinent family history.  Social History Social History   Tobacco Use  . Smoking status: Current Every Day Smoker  . Smokeless tobacco: Never Used  Substance Use Topics  . Alcohol use: Yes    Alcohol/week: 0.0 oz  . Drug use: No     Allergies   Patient has no known allergies.   Review of Systems Review of Systems  Musculoskeletal: Positive for joint swelling (around laceration Left hand). Negative for  arthralgias and myalgias.  Skin: Positive for wound. Negative for color change.  Neurological: Positive for numbness (mild around laceration Left hand). Negative for weakness.     Physical Exam Triage Vital Signs ED Triage Vitals  Enc Vitals Group     BP 01/16/18 0815 (!) 171/94     Pulse Rate 01/16/18 0815 79     Resp --      Temp --      Temp src --      SpO2 01/16/18 0815 98 %     Weight 01/16/18 0816 226 lb (102.5 kg)     Height 01/16/18 0816 5\' 9"  (1.753 m)     Head Circumference --      Peak Flow --      Pain Score 01/16/18 0816 0     Pain Loc --      Pain Edu? --      Excl. in GC? --    No data found.  Updated Vital Signs BP (!) 171/94 (BP Location: Right Arm)   Pulse 79   Ht 5\' 9"  (1.753 m)   Wt 226 lb (102.5 kg)   SpO2 98%   BMI 33.37 kg/m   Visual Acuity Right Eye Distance:   Left Eye Distance:   Bilateral Distance:    Right Eye Near:   Left Eye Near:    Bilateral Near:  Physical Exam  Constitutional: He is oriented to person, place, and time. He appears well-developed and well-nourished.  HENT:  Head: Normocephalic and atraumatic.  Eyes: EOM are normal.  Neck: Normal range of motion.  Cardiovascular: Normal rate.  Pulmonary/Chest: Effort normal.  Musculoskeletal: Normal range of motion. He exhibits edema and tenderness.       Left hand: He exhibits laceration. Normal sensation noted. Normal strength noted.       Hands: Left hand, dorsal aspect: full ROM, mild edema at just proximal to 3rd MCP joint. Mildly tender.   Neurological: He is alert and oriented to person, place, and time.  Skin: Skin is warm and dry.  Psychiatric: He has a normal mood and affect. His behavior is normal.  Nursing note and vitals reviewed.    UC Treatments / Results  Labs (all labs ordered are listed, but only abnormal results are displayed) Labs Reviewed - No data to display  EKG  EKG Interpretation None       Radiology No results  found.  Procedures Laceration Repair Date/Time: 01/16/2018 9:13 AM Performed by: Lurene Shadow, PA-C Authorized by: Lattie Haw, MD   Consent:    Consent obtained:  Verbal   Consent given by:  Patient   Risks discussed:  Infection, pain, poor cosmetic result and poor wound healing   Alternatives discussed:  Delayed treatment and no treatment Anesthesia (see MAR for exact dosages):    Anesthesia method:  Local infiltration   Local anesthetic:  Lidocaine 1% WITH epi Laceration details:    Location:  Hand   Hand location:  L hand, dorsum   Length (cm):  4   Depth (mm):  3 Repair type:    Repair type:  Simple Pre-procedure details:    Preparation:  Patient was prepped and draped in usual sterile fashion Exploration:    Hemostasis achieved with:  Direct pressure   Wound exploration: wound explored through full range of motion and entire depth of wound probed and visualized     Wound extent: no areolar tissue violation noted, no fascia violation noted, no foreign bodies/material noted, no muscle damage noted, no nerve damage noted, no tendon damage noted, no underlying fracture noted and no vascular damage noted     Contaminated: no   Treatment:    Area cleansed with:  Hibiclens and saline   Amount of cleaning:  Extensive   Irrigation solution:  Sterile saline and tap water   Irrigation volume:  40   Irrigation method:  Syringe Skin repair:    Repair method:  Sutures   Suture size:  4-0   Suture material:  Prolene   Suture technique:  Simple interrupted   Number of sutures:  4 Approximation:    Approximation:  Close   Vermilion border: well-aligned   Post-procedure details:    Dressing:  Antibiotic ointment and bulky dressing   Patient tolerance of procedure:  Tolerated well, no immediate complications   (including critical care time)  Medications Ordered in UC Medications  Tdap (BOOSTRIX) injection 0.5 mL (0.5 mLs Intramuscular Given 01/16/18 0829)     Initial  Impression / Assessment and Plan / UC Course  I have reviewed the triage vital signs and the nursing notes.  Pertinent labs & imaging results that were available during my care of the patient were reviewed by me and considered in my medical decision making (see chart for details).     Laceration to Left hand w/o complication Tdap updated today.  Wound  closure as noted above Pt notes he is Left hand dominant and does go to the gym about 4 times a week. Encouraged to use caution the first few days with grasping in Left hand to help prevent sutures from coming out prematurely.  Home care instructions provided F/u with UC in 10 days for suture removal   Final Clinical Impressions(s) / UC Diagnoses   Final diagnoses:  Laceration of left hand without foreign body, initial encounter    ED Discharge Orders    None       Controlled Substance Prescriptions [place] Controlled Substance Registry consulted? Not Applicable   Rolla Plate 01/16/18 1610

## 2018-01-16 NOTE — Discharge Instructions (Signed)
  You may take 500mg acetaminophen every 4-6 hours or in combination with ibuprofen 400-600mg every 6-8 hours as needed for pain and inflammation.  

## 2018-01-16 NOTE — ED Triage Notes (Signed)
Pt cut hand on metal piece about 7:50 this am.  Cut to the left hand middle knuckle, approximately 2"long.

## 2018-01-30 ENCOUNTER — Other Ambulatory Visit: Payer: Self-pay

## 2018-01-30 ENCOUNTER — Encounter: Payer: Self-pay | Admitting: *Deleted

## 2018-01-30 ENCOUNTER — Emergency Department (INDEPENDENT_AMBULATORY_CARE_PROVIDER_SITE_OTHER)
Admission: EM | Admit: 2018-01-30 | Discharge: 2018-01-30 | Disposition: A | Discharge status: Home / Self Care | Payer: 59 | Source: Home / Self Care | Attending: Emergency Medicine | Admitting: Emergency Medicine

## 2018-01-30 DIAGNOSIS — Z4802 Encounter for removal of sutures: Secondary | ICD-10-CM

## 2018-01-30 NOTE — Discharge Instructions (Signed)
Please return to clinic if any problems.

## 2018-01-30 NOTE — ED Triage Notes (Signed)
Pt is here today for suture removal from the top of his LT hand placed on 01/16/18. He reports one of the suture fell out.

## 2018-01-30 NOTE — ED Provider Notes (Signed)
[facility]    CSN: [id number] Arrival date & time: 01/30/18  0801     History   Chief Complaint Chief Complaint  Patient presents with  . Suture / Staple Removal    HPI Matthew Escobar is a 62 y.o. male.  Patient enters for suture removal left hand. The sutures have been present for 2 weeks. He has no redness or drainage from the laceration repair. He has no numbness or weakness. HPI  History reviewed. No pertinent past medical history.  Patient Active Problem List   Diagnosis Date Noted  . Lumbosacral strain 03/18/2017    History reviewed. No pertinent surgical history.     Home Medications    Prior to Admission medications   Medication Sig Start Date End Date Taking? Authorizing Provider  atorvastatin (LIPITOR) 10 MG tablet Take 10 mg by mouth.    [provider]  cyclobenzaprine (FLEXERIL) 10 MG tablet Take 1 tablet (10 mg total) by mouth 3 (three) times daily as needed for muscle spasms. 03/18/17   Rodolph Bong, MD  lisinopril (PRINIVIL,ZESTRIL) 10 MG tablet TAKE ONE (1) TABLET BY MOUTH EVERY DAY 09/20/15   [provider]  LORazepam (ATIVAN) 1 MG tablet TAKE ONE TABLET BY MOUTH TWICE EVERY DAY -- AS NEEDED 09/12/15   [provider]    Family History History reviewed. No pertinent family history.  Social History Social History   Tobacco Use  . Smoking status: Current Every Day Smoker  . Smokeless tobacco: Never Used  Substance Use Topics  . Alcohol use: Yes    Alcohol/week: 0.0 oz  . Drug use: No     Allergies   Patient has no known allergies.   Review of Systems Review of Systems  Constitutional: Negative.   Musculoskeletal: Negative.      Physical Exam Triage Vital Signs ED Triage Vitals  Enc Vitals Group     BP --      Pulse --      Resp --      Temp 01/30/18 0815 98.4 F (36.9 C)     Temp Source 01/30/18 0815 Oral     SpO2 --      Weight --      Height --      Head Circumference --    Peak Flow --      Pain Score 01/30/18 0816 0     Pain Loc --      Pain Edu? --      Excl. in GC? --    No data found.  Updated Vital Signs Temp 98.4 F (36.9 C) (Oral)   Visual Acuity Right Eye Distance:   Left Eye Distance:   Bilateral Distance:    Right Eye Near:   Left Eye Near:    Bilateral Near:     Physical Exam  Musculoskeletal:  There are 3 sutures present over the distal dorsum of the left hand. The wound has healed nicely. This area was cleaned with alcohol then cleaned with Betadine followed by a gauze wipe. 3 sutures were removed without difficulty. Patient tolerated well.     UC Treatments / Results  Labs (all labs ordered are listed, but only abnormal results are displayed) Labs Reviewed - No data to display  EKG  EKG Interpretation None       Radiology No results found.  Procedures Procedures (including critical care time)  Medications Ordered in UC Medications - No data to display   Initial Impression / Assessment  and Plan / UC Course  I have reviewed the triage vital signs and the nursing notes.  Pertinent labs & imaging results that were available during my care of the patient were reviewed by me and considered in my medical decision making (see chart for details). Patient here for suture removal. These were removed without difficulty. Patient tolerated well. Band-Aid was applied.      Final Clinical Impressions(s) / UC Diagnoses   Final diagnoses:  Visit for suture removal    ED Discharge Orders    None       Controlled Substance Prescriptions [place] Controlled Substance Registry consulted? Not Applicable   Collene Gobbleaub, Steven A, MD 01/30/18 [phone]

## 2018-11-03 ENCOUNTER — Other Ambulatory Visit: Payer: Self-pay | Admitting: Internal Medicine

## 2018-11-03 DIAGNOSIS — N644 Mastodynia: Secondary | ICD-10-CM

## 2018-11-05 ENCOUNTER — Other Ambulatory Visit: Payer: Self-pay | Admitting: Internal Medicine

## 2018-11-05 DIAGNOSIS — Z87891 Personal history of nicotine dependence: Secondary | ICD-10-CM

## 2018-11-06 ENCOUNTER — Ambulatory Visit: Payer: 59

## 2018-11-06 DIAGNOSIS — Z87891 Personal history of nicotine dependence: Secondary | ICD-10-CM

## 2018-11-07 ENCOUNTER — Ambulatory Visit
Admission: RE | Admit: 2018-11-07 | Discharge: 2018-11-07 | Disposition: A | Discharge status: Home / Self Care | Payer: 59 | Source: Ambulatory Visit | Attending: Internal Medicine | Admitting: Internal Medicine

## 2018-11-07 DIAGNOSIS — N644 Mastodynia: Secondary | ICD-10-CM

## 2019-03-27 ENCOUNTER — Emergency Department
Admission: EM | Admit: 2019-03-27 | Discharge: 2019-03-27 | Disposition: A | Discharge status: Home / Self Care | Payer: 59 | Source: Home / Self Care | Attending: Family Medicine | Admitting: Family Medicine

## 2019-03-27 ENCOUNTER — Other Ambulatory Visit: Payer: Self-pay

## 2019-03-27 ENCOUNTER — Emergency Department (INDEPENDENT_AMBULATORY_CARE_PROVIDER_SITE_OTHER): Payer: 59

## 2019-03-27 ENCOUNTER — Encounter: Payer: Self-pay | Admitting: Emergency Medicine

## 2019-03-27 DIAGNOSIS — S4991XA Unspecified injury of right shoulder and upper arm, initial encounter: Secondary | ICD-10-CM

## 2019-03-27 DIAGNOSIS — X500XXA Overexertion from strenuous movement or load, initial encounter: Secondary | ICD-10-CM | POA: Diagnosis not present

## 2019-03-27 DIAGNOSIS — S43401A Unspecified sprain of right shoulder joint, initial encounter: Secondary | ICD-10-CM

## 2019-03-27 MED ORDER — HYDROCODONE-ACETAMINOPHEN 5-325 MG PO TABS: ORAL_TABLET | ORAL | 0 refills | Status: DC

## 2019-03-27 NOTE — Discharge Instructions (Addendum)
Apply ice pack for 20 to 30 minutes, 3 to 4 times daily  Continue until pain and swelling decrease.  Begin range of motion and stretching exercises as tolerated. 

## 2019-03-27 NOTE — ED Provider Notes (Signed)
Matthew Escobar CARE    CSN: [id number] Arrival date & time: 03/27/19  1254     History   Chief Complaint Chief Complaint  Patient presents with  . Shoulder Pain    HPI Matthew Escobar is a 63 y.o. male.   While doing "hammer curls" yesterday, patient felt a sudden pulling pain in his right shoulder.  He has had significant decreased range of motion and increasing pain that has not improved.  He denies distal paresthesias or loss of strength.     The history is provided by the patient.  Shoulder Injury  This is a new problem. The current episode started yesterday. The problem occurs constantly. The problem has not changed since onset.Pertinent negatives include no chest pain. Exacerbated by: right shoulder movement  Nothing relieves the symptoms. Treatments tried: ice pack. The treatment provided no relief.    History reviewed. No pertinent past medical history.  Patient Active Problem List   Diagnosis Date Noted  . Lumbosacral strain 03/18/2017    History reviewed. No pertinent surgical history.     Home Medications    Prior to Admission medications   Medication Sig Start Date End Date Taking? Authorizing Provider  atorvastatin (LIPITOR) 10 MG tablet Take 10 mg by mouth.    [provider]  cyclobenzaprine (FLEXERIL) 10 MG tablet Take 1 tablet (10 mg total) by mouth 3 (three) times daily as needed for muscle spasms. 03/18/17   Rodolph Bong, MD  HYDROcodone-acetaminophen (NORCO/VICODIN) 5-325 MG tablet Take one by mouth TID prn pain 03/27/19   Lattie Haw, MD  lisinopril (PRINIVIL,ZESTRIL) 10 MG tablet TAKE ONE (1) TABLET BY MOUTH EVERY DAY 09/20/15   [provider]  LORazepam (ATIVAN) 1 MG tablet TAKE ONE TABLET BY MOUTH TWICE EVERY DAY -- AS NEEDED 09/12/15   [provider]    Family History Family History  Problem Relation Age of Onset  . Breast cancer Sister   . Breast cancer Maternal Grandmother     Social History Social  History   Tobacco Use  . Smoking status: Current Every Day Smoker  . Smokeless tobacco: Never Used  Substance Use Topics  . Alcohol use: Yes    Alcohol/week: 0.0 standard drinks  . Drug use: No     Allergies   Sulfa antibiotics   Review of Systems Review of Systems  Cardiovascular: Negative for chest pain.  Musculoskeletal:       Right shoulder pain   Neurological: Negative for numbness.  All other systems reviewed and are negative.    Physical Exam Triage Vital Signs ED Triage Vitals  Enc Vitals Group     BP 03/27/19 1327 (!) 147/80     Pulse --      Resp --      Temp 03/27/19 1327 98.4 F (36.9 C)     Temp Source 03/27/19 1327 Oral     SpO2 03/27/19 1327 98 %     Weight 03/27/19 1329 230 lb (104.3 kg)     Height --      Head Circumference --      Peak Flow --      Pain Score 03/27/19 1328 9     Pain Loc --      Pain Edu? --      Excl. in GC? --    No data found.  Updated Vital Signs BP (!) 147/80 (BP Location: Right Arm)   Temp 98.4 F (36.9 C) (Oral)   Wt 104.3 kg  SpO2 98%   BMI 33.97 kg/m   Visual Acuity Right Eye Distance:   Left Eye Distance:   Bilateral Distance:    Right Eye Near:   Left Eye Near:    Bilateral Near:     Physical Exam Vitals signs and nursing note reviewed.  Constitutional:      General: He is not in acute distress. HENT:     Head: Normocephalic.     Right Ear: External ear normal.     Left Ear: External ear normal.     Nose: Nose normal.  Eyes:     Pupils: Pupils are equal, round, and reactive to light.  Neck:     Musculoskeletal: Normal range of motion.  Cardiovascular:     Heart sounds: Normal heart sounds.  Pulmonary:     Breath sounds: Normal breath sounds.  Musculoskeletal:     Right shoulder: He exhibits decreased range of motion and decreased strength. He exhibits no tenderness, no bony tenderness, no swelling, no effusion, no crepitus and no deformity.     Comments: Right shoulder has no swelling  or localized tenderness to palpation.  Distal neurovascular function is intact.  Patient is unable to actively abduct to horizontal; also unable to passively abduct to horizontal. External rotation fairly good, but internal/external strength decreased.  Unable to perform Apley's and Empty can tests.  Skin:    General: Skin is warm and dry.     Findings: No rash.  Neurological:     Mental Status: He is alert.      UC Treatments / Results  Labs (all labs ordered are listed, but only abnormal results are displayed) Labs Reviewed - No data to display  EKG None  Radiology Dg Shoulder Right  Result Date: 03/27/2019 CLINICAL DATA:  Right shoulder injury lifting weights yesterday. Initial encounter. EXAM: RIGHT SHOULDER - 2+ VIEW COMPARISON:  None. FINDINGS: There is no evidence of fracture or dislocation. There is no evidence of arthropathy or other focal bone abnormality. Soft tissues are unremarkable. IMPRESSION: Negative exam. Electronically Signed   By: Drusilla Kannerhomas  Dalessio M.D.   On: 03/27/2019 13:54    Procedures Procedures (including critical care time)  Medications Ordered in UC Medications - No data to display  Initial Impression / Assessment and Plan / UC Course  I have reviewed the triage vital signs and the nursing notes.  Pertinent labs & imaging results that were available during my care of the patient were reviewed by me and considered in my medical decision making (see chart for details).    Suspect rotator cuff injury.  Rx for Lortab. Controlled Substance Prescriptions I have consulted the [place] Controlled Substances Registry for this patient, and feel the risk/benefit ratio today is favorable for proceeding with this prescription for a controlled substance.   Followup with Dr. Clementeen GrahamEvan Corey ([facility]) for further evaluation.   Final Clinical Impressions(s) / UC Diagnoses   Final diagnoses:  Sprain of right shoulder, unspecified shoulder sprain type,  initial encounter     Discharge Instructions     Apply ice pack for 20 to 30 minutes, 3 to 4 times daily  Continue until pain and swelling decrease.  Begin range of motion and stretching exercises as tolerated.    ED Prescriptions    Medication Sig Dispense Auth. Provider   HYDROcodone-acetaminophen (NORCO/VICODIN) 5-325 MG tablet Take one by mouth TID prn pain 15 tablet Lattie HawBeese, Stephen A, MD        Lattie HawBeese, Stephen A, MD 04/05/19  2130  

## 2019-03-27 NOTE — ED Triage Notes (Signed)
Pt c/o right shoulder pain after weight lifting last night.

## 2019-03-30 ENCOUNTER — Encounter: Payer: Self-pay | Admitting: Family Medicine

## 2019-03-30 ENCOUNTER — Ambulatory Visit: Payer: 59 | Admitting: Family Medicine

## 2019-03-30 DIAGNOSIS — S46012A Strain of muscle(s) and tendon(s) of the rotator cuff of left shoulder, initial encounter: Secondary | ICD-10-CM

## 2019-03-30 DIAGNOSIS — M25511 Pain in right shoulder: Secondary | ICD-10-CM

## 2019-03-30 DIAGNOSIS — M75102 Unspecified rotator cuff tear or rupture of left shoulder, not specified as traumatic: Secondary | ICD-10-CM | POA: Insufficient documentation

## 2019-03-30 MED ORDER — OXYCODONE-ACETAMINOPHEN 5-325 MG PO TABS: 1.0000 | ORAL_TABLET | Freq: Three times a day (TID) | ORAL | 0 refills | Status: DC | PRN

## 2019-03-30 NOTE — Patient Instructions (Addendum)
Thank you for coming in today. You should hear about the MRI soon.  Let me know if you do not hear anything.  Use the sling as needed.  Use oxycodone for severe pain.    Work on range of motion exercises.    Surgery for Rotator Cuff Tear  The rotator cuff is a group of muscles and connective tissues (tendons) that surround the shoulder joint and keep the upper arm bone (humerus) in the shoulder socket. A tendon is the place on a muscle where it attaches to a bone. Surgery may be done to repair a partial or complete tear in the rotator cuff that cannot be treated by nonsurgical methods. The exact procedure that you have depends on your injury. If you have a partial tear, you may have surgery to reattach a tendon to the humerus. If you have a complete tear, you may have surgery to sew the two sides of the tear back together. Surgery may be done through small incisions using an operating telescope (arthroscope), through a larger (open) incision, or through a combination of both. Tell a health care provider about:  Any allergies you have.  All medicines you are taking, including vitamins, herbs, eye drops, creams, and over-the-counter medicines.  Any problems you or family members have had with anesthetic medicines.  Any blood disorders you have.  Any surgeries you have had.  Any medical conditions you have.  Whether you are pregnant or may be pregnant. What are the risks? Generally, this is a safe procedure. However, problems may occur, including:  Infection.  Bleeding.  Allergic reactions to medicines or materials used during the procedure.  Damage to nerves, blood vessels, or shoulder muscles.  Permanent loss of full shoulder movement (stiffness). What happens before the procedure? Staying hydrated Follow instructions from your health care provider about hydration, which may include:  Up to 2 hours before the procedure - you may continue to drink clear liquids, such as  water, clear fruit juice, black coffee, and plain tea. Eating and drinking restrictions Follow instructions from your health care provider about eating and drinking, which may include:  8 hours before the procedure - stop eating heavy meals or foods such as meat, fried foods, or fatty foods.  6 hours before the procedure - stop eating light meals or foods, such as toast or cereal.  6 hours before the procedure - stop drinking milk or drinks that contain milk.  2 hours before the procedure - stop drinking clear liquids. Other instructions  Ask your health care provider about: ? Changing or stopping your regular medicines. This is especially important if you are taking diabetes medicines or blood thinners. ? Taking medicines such as aspirin and ibuprofen. These medicines can thin your blood. Do not take these medicines before your procedure if your health care provider instructs you not to.  Plan to have someone take you home from the hospital or clinic.  If you will be going home right after the procedure, plan to have someone with you for 24 hours.  Ask your health care provider how your surgical site will be marked or identified.  You may be given antibiotic medicine to help prevent infection. What happens during the procedure?  To reduce your risk of infection: ? Your health care team will wash or sanitize their hands. ? Your skin will be washed with soap.  An IV tube will be inserted into one of your veins.  You will be given one or more of  the following: ? A medicine to help you relax (sedative). ? A medicine to make you fall asleep (general anesthetic). ? A medicine that is injected into an area of your body to numb everything beyond the injection site (regional anesthetic).  Your surgeon will move your shoulder to observe your injury.  If you are having arthroscopic surgery: ? Small incisions will be made in the front and back of your shoulder. ? An arthroscope will be  inserted through these incisions to examine the inside of your shoulder and plan the surgery.  If you are having open surgery, a wider incision will be made in your shoulder.  Some of the muscle covering your shoulder (deltoid) may be moved to expose your rotator cuff.  Bony growths that might interfere with healing will be removed.  Your rotator cuff will be trimmed around the area where it has torn away from your humerus.  If your rotator cuff is completely torn, the split ends will be sewn back together.  Anchoring inserts will be placed into your humerus in the area where the tendon has torn away from the bone.  The torn end of your rotator cuff will be re-attached (anchored) to your humerus using stitches and small screws.  Your incisions will be closed with sutures.  The incision in your skin will be covered with a bandage (dressing) and medicine.  Your arm will be placed in a sling. The procedure may vary among health care providers and hospitals. What happens after the procedure?  Your blood pressure, heart rate, breathing rate, and blood oxygen level will be monitored often until the medicines you were given have worn off.  You may have some pain. Medicines will be available to help you.  Do not drive for 24 hours if you received a sedative, or until your health care provider approves. This information is not intended to replace advice given to you by your health care provider. Make sure you discuss any questions you have with your health care provider. Document Released: 12/17/2015 Document Revised: 05/10/2016 Document Reviewed: 12/17/2015 Elsevier Interactive Patient Education  2019 [employer].

## 2019-03-30 NOTE — Progress Notes (Signed)
Matthew Escobar is a 63 y.o. male who [facility] today for right shoulder pain.  Matthew LongsJoseph was in his normal state of health on Thursday April 9th.  He was doing hammer curls and felt a pulling sensation in his shoulder.  He developed significant pain.  He notes significant pain arm abduction.  Pain is worse with activity and better with rest.  He was seen in urgent care on April 10 where x-rays were unremarkable.  He was thought to have a rotator cuff tear was given hydrocodone and a sling which is helped only a little.  He denies any radiating pain weakness or numbness distal to the elbow fevers or chills.   ROS:  As above  Exam:  BP (!) 150/74   Pulse 71   Wt 234 lb (106.1 kg)   BMI 34.56 kg/m  Wt Readings from Last 5 Encounters:  03/30/19 234 lb (106.1 kg)  03/27/19 230 lb (104.3 kg)  01/16/18 226 lb (102.5 kg)  03/18/17 245 lb (111.1 kg)  11/17/15 238 lb (108 kg)   General: Well Developed, well nourished, and in no acute distress.  Neuro/Psych: Alert and oriented x3, extra-ocular muscles intact, able to move all 4 extremities, sensation grossly intact. Skin: Warm and dry, no rashes noted.  Respiratory: Not using accessory muscles, speaking in full sentences, trachea midline.  Cardiovascular: Pulses palpable, no extremity edema. Abdomen: Does not appear distended. MSK:  Right shoulder normal-appearing with no deformity visible. Tender to palpation anterior shoulder.  Not exquisitely tender in the bicipital groove. Range of motion normal external rotation.  Very limited abduction due to pain. Limited internal rotation due to pain. Strength: Significantly limited abduction.  Painful external and internal rotation strength but somewhat present. Patient guarding too much for impingement testing. Negative Yergason's and speeds test.  Elbow flexion strength is intact without pain.   Left shoulder normal-appearing nontender  normal motion normal strength negative impingement testing  Pulses capillary refill and sensation are intact distal bilateral upper extremities    Lab and Radiology Results No results found for this or any previous visit (from the past 72 hour(s)). Dg Shoulder Right  Result Date: 03/27/2019 CLINICAL DATA:  Right shoulder injury lifting weights yesterday. Initial encounter. EXAM: RIGHT SHOULDER - 2+ VIEW COMPARISON:  None. FINDINGS: There is no evidence of fracture or dislocation. There is no evidence of arthropathy or other focal bone abnormality. Soft tissues are unremarkable. IMPRESSION: Negative exam. Electronically Signed   By: Drusilla Kannerhomas  Dalessio M.D.   On: 03/27/2019 13:54   I personally (independently) visualized and performed the interpretation of the images attached in this note.  Limited musculoskeletal ultrasound of right shoulder Intact appearing biceps tendon in the bicipital groove Subscapularis intact without large full-thickness tear visible.  Hypoechoic changes present linearly through the tendon seen on both longitudinal and transverse imaging consistent with partial thickness tear.  No retraction visible. Supraspinatus tendon is intact without complete rupture or retraction.  Hypoechoic change seen at the articular side consistent with partial thickness tear without significant retraction. Infraspinatus tendon is intact. Impression: Probable rotator cuff tear doubtful for full-thickness complete rotator cuff tear with retraction.    Assessment and Plan: 63 y.o. male with  Acute right shoulder pain occurring with significant force consistent and exam with rotator cuff tear.  Patient is significantly limited and impaired and has significant weakness.  Ultrasound today does not show obvious full-thickness tear however I am concerned he may have a worse  tear than is visible on ultrasound.  Plan to proceed with MRI for potential surgical planning.  Will treat with oxycodone for  pain control as hydrocodone not sufficient.  Use sling as needed.  Work on passive range of motion exercises as well.    PDMP reviewed during this encounter. Orders Placed This Encounter  Procedures  . MR Shoulder Right Wo Contrast    Standing Status:   Future    Standing Expiration Date:   05/29/2020    Order Specific Question:   What is the patient's sedation requirement?    Answer:   No Sedation    Order Specific Question:   Does the patient have a pacemaker or implanted devices?    Answer:   No    Order Specific Question:   Preferred imaging location?    Answer:   [facility] (table limit-350lbs)    Order Specific Question:   Radiology Contrast Protocol - do NOT remove file path    Answer:   [email or web address]   Meds ordered this encounter  Medications  . oxyCODONE-acetaminophen (PERCOCET/ROXICET) 5-325 MG tablet    Sig: Take 1 tablet by mouth every 8 (eight) hours as needed for severe pain.    Dispense:  15 tablet    Refill:  0    Replaces norco    Historical information moved to improve visibility of documentation.  Past Medical History:  Diagnosis Date  . Hypertension    Past Surgical History:  Procedure Laterality Date  . ABDOMINAL SURGERY     Social History   Tobacco Use  . Smoking status: Current Every Day Smoker  . Smokeless tobacco: Never Used  Substance Use Topics  . Alcohol use: Yes    Alcohol/week: 0.0 standard drinks   family history includes Breast cancer in his maternal grandmother and sister.  Medications: Current Outpatient Medications  Medication Sig Dispense Refill  . atorvastatin (LIPITOR) 10 MG tablet Take 10 mg by mouth.    . cyclobenzaprine (FLEXERIL) 10 MG tablet Take 1 tablet (10 mg total) by mouth 3 (three) times daily as needed for muscle spasms. 30 tablet 0  . lisinopril (PRINIVIL,ZESTRIL) 10 MG tablet TAKE ONE (1) TABLET BY MOUTH EVERY DAY  11  . LORazepam (ATIVAN) 1 MG tablet TAKE ONE TABLET BY  MOUTH TWICE EVERY DAY -- AS NEEDED  0  . oxyCODONE-acetaminophen (PERCOCET/ROXICET) 5-325 MG tablet Take 1 tablet by mouth every 8 (eight) hours as needed for severe pain. 15 tablet 0   No current facility-administered medications for this visit.    Allergies  Allergen Reactions  . Sulfa Antibiotics       Discussed warning signs or symptoms. Please see discharge instructions. Patient expresses understanding.

## 2019-04-01 ENCOUNTER — Telehealth: Payer: Self-pay

## 2019-04-01 NOTE — Telephone Encounter (Signed)
error 

## 2019-04-03 ENCOUNTER — Telehealth: Payer: Self-pay | Admitting: Family Medicine

## 2019-04-03 NOTE — Telephone Encounter (Signed)
Had peer to peer phone call today to authorize shoulder MRI. MRI authorized. Radiology scheduling Myriam Jacobson) notified. She will contact the patient. Auth: [id number] Exp 05/18/19

## 2019-04-03 NOTE — Addendum Note (Signed)
Addended by: Mallie Snooks R on: 04/03/2019 12:25 PM   Modules accepted: Orders

## 2019-04-03 NOTE — Telephone Encounter (Signed)
Order updated.  Thank you.

## 2019-04-05 ENCOUNTER — Other Ambulatory Visit: Payer: Self-pay

## 2019-04-05 ENCOUNTER — Ambulatory Visit (INDEPENDENT_AMBULATORY_CARE_PROVIDER_SITE_OTHER): Payer: 59

## 2019-04-05 DIAGNOSIS — S46012A Strain of muscle(s) and tendon(s) of the rotator cuff of left shoulder, initial encounter: Secondary | ICD-10-CM

## 2019-04-05 DIAGNOSIS — M25511 Pain in right shoulder: Secondary | ICD-10-CM

## 2019-04-08 ENCOUNTER — Encounter: Payer: Self-pay | Admitting: Family Medicine

## 2019-04-08 ENCOUNTER — Ambulatory Visit: Payer: 59 | Admitting: Family Medicine

## 2019-04-08 ENCOUNTER — Ambulatory Visit (INDEPENDENT_AMBULATORY_CARE_PROVIDER_SITE_OTHER): Payer: 59 | Admitting: Family Medicine

## 2019-04-08 VITALS — BP 140/82 | HR 77 | Temp 98.2°F | Wt 233.0 lb

## 2019-04-08 DIAGNOSIS — M25511 Pain in right shoulder: Secondary | ICD-10-CM | POA: Diagnosis not present

## 2019-04-08 DIAGNOSIS — S46011A Strain of muscle(s) and tendon(s) of the rotator cuff of right shoulder, initial encounter: Secondary | ICD-10-CM | POA: Insufficient documentation

## 2019-04-08 MED ORDER — OXYCODONE-ACETAMINOPHEN 5-325 MG PO TABS: 1.0000 | ORAL_TABLET | Freq: Three times a day (TID) | ORAL | 0 refills | Status: AC | PRN

## 2019-04-08 NOTE — Patient Instructions (Addendum)
Thank you for coming in today.  Call or go to the ER if you develop a large red swollen joint with extreme pain or oozing puss.   You should hear from Physical Therapy soon in the near future.   Continue to participate in home exercises.     Shoulder Impingement Syndrome Rehab Ask your health care provider which exercises are safe for you. Do exercises exactly as told by your health care provider and adjust them as directed. It is normal to feel mild stretching, pulling, tightness, or discomfort as you do these exercises, but you should stop right away if you feel sudden pain or your pain gets worse.Do not begin these exercises until told by your health care provider. Stretching and range of motion exercise This exercise warms up your muscles and joints and improves the movement and flexibility of your shoulder. This exercise also helps to relieve pain and stiffness. Exercise A: Passive horizontal adduction  1. Sit or stand and pull your left / right elbow across your chest, toward your other shoulder. Stop when you feel a gentle stretch in the back of your shoulder and upper arm. ? Keep your arm at shoulder height. ? Keep your arm as close to your body as you comfortably can. 2. Hold for __________ seconds. 3. Slowly return to the starting position. Repeat __________ times. Complete this exercise __________ times a day. Strengthening exercises These exercises build strength and endurance in your shoulder. Endurance is the ability to use your muscles for a long time, even after they get tired. Exercise B: External rotation, isometric 1. Stand or sit in a doorway, facing the door frame. 2. Bend your left / right elbow and place the back of your wrist against the door frame. Only your wrist should be touching the frame. Keep your upper arm at your side. 3. Gently press your wrist against the door frame, as if you are trying to push your arm away from your abdomen. ? Avoid shrugging your  shoulder while you press your hand against the door frame. Keep your shoulder blade tucked down toward the middle of your back. 4. Hold for __________ seconds. 5. Slowly release the tension, and relax your muscles completely before you do the exercise again. Repeat __________ times. Complete this exercise __________ times a day. Exercise C: Internal rotation, isometric  1. Stand or sit in a doorway, facing the door frame. 2. Bend your left / right elbow and place the inside of your wrist against the door frame. Only your wrist should be touching the frame. Keep your upper arm at your side. 3. Gently press your wrist against the door frame, as if you are trying to push your arm toward your abdomen. ? Avoid shrugging your shoulder while you press your hand against the door frame. Keep your shoulder blade tucked down toward the middle of your back. 4. Hold for __________ seconds. 5. Slowly release the tension, and relax your muscles completely before you do the exercise again. Repeat __________ times. Complete this exercise __________ times a day. Exercise D: Scapular protraction, supine  1. Lie on your back on a firm surface. Hold a __________ weight in your left / right hand. 2. Raise your left / right arm straight into the air so your hand is directly above your shoulder joint. 3. Push the weight into the air so your shoulder lifts off of the surface that you are lying on. Do not move your head, neck, or back. 4. Hold for  __________ seconds. 5. Slowly return to the starting position. Let your muscles relax completely before you repeat this exercise. Repeat __________ times. Complete this exercise __________ times a day. Exercise E: Scapular retraction  1. Sit in a stable chair without armrests, or stand. 2. Secure an exercise band to a stable object in front of you so the band is at shoulder height. 3. Hold one end of the exercise band in each hand. Your palms should face down. 4. Squeeze  your shoulder blades together and move your elbows slightly behind you. Do not shrug your shoulders while you do this. 5. Hold for __________ seconds. 6. Slowly return to the starting position. Repeat __________ times. Complete this exercise __________ times a day. Exercise F: Shoulder extension  1. Sit in a stable chair without armrests, or stand. 2. Secure an exercise band to a stable object in front of you where the band is above shoulder height. 3. Hold one end of the exercise band in each hand. 4. Straighten your elbows and lift your hands up to shoulder height. 5. Squeeze your shoulder blades together and pull your hands down to the sides of your thighs. Stop when your hands are straight down by your sides. Do not let your hands go behind your body. 6. Hold for __________ seconds. 7. Slowly return to the starting position. Repeat __________ times. Complete this exercise __________ times a day. This information is not intended to replace advice given to you by your health care provider. Make sure you discuss any questions you have with your health care provider. Document Released: 12/03/2005 Document Revised: 08/09/2016 Document Reviewed: 11/05/2015 Elsevier Interactive Patient Education  2019 [employer].

## 2019-04-08 NOTE — Progress Notes (Signed)
Patient was originally scheduled today for video visit for shoulder pain.  After discussion plan to proceed with in person assessment to proceed with injection as well as to discuss findings in more detail.

## 2019-04-08 NOTE — Progress Notes (Signed)
Jacqulyn DuckingJoseph Dapolito is a 63 y.o. male who [facility] today for shoulder pain.  Patient was seen for right shoulder pain concern for significant rotator cuff tear seen on ultrasound.  He had MRI for potential surgical planning and fortunately does not have a complete rotator cuff tear or avulsion.  MRI did show a full-thickness partial width tear of the supraspinatus tendon with gap of about 10 mm.  He notes continued shoulder pain and wants to know what he can do to feel better.   He had initial visit today using video conferencing software but decision was made to transition to in person visit for injection.  ROS:  As above  Exam:  BP 140/82   Pulse 77   Temp 98.2 F (36.8 C) (Oral)   Wt 233 lb (105.7 kg)   BMI 34.41 kg/m  Wt Readings from Last 5 Encounters:  04/08/19 233 lb (105.7 kg)  03/30/19 234 lb (106.1 kg)  03/27/19 230 lb (104.3 kg)  01/16/18 226 lb (102.5 kg)  03/18/17 245 lb (111.1 kg)   General: Well Developed, well nourished, and in no acute distress.  Neuro/Psych: Alert and oriented x3, extra-ocular muscles intact, able to move all 4 extremities, sensation grossly intact. Skin: Warm and dry, no rashes noted.  Respiratory: Not using accessory muscles, speaking in full sentences, trachea midline.  Cardiovascular: Pulses palpable, no extremity edema. Abdomen: Does not appear distended. MSK:  Right shoulder normal-appearing no significant deformity. Tender palpation AC joint. Decreased range of motion to abduction.  Decreased strength abduction. Positive impingement testing.  Procedure: Real-time Ultrasound Guided Injection of right shoulder subacromial bursa Device: GE Logiq E   Images permanently stored and available for review in the ultrasound unit. Verbal informed consent obtained.  Discussed risks and benefits of procedure. Warned about infection bleeding damage to structures skin hypopigmentation and fat  atrophy among others. Patient expresses understanding and agreement Time-out conducted.   Noted no overlying erythema, induration, or other signs of local infection.   Skin prepped in a sterile fashion.   Local anesthesia: Topical Ethyl chloride.   With sterile technique and under real time ultrasound guidance:  40 mg of Kenalog and 2 mL of Marcaine injected easily.   Completed without difficulty   Pain partially  resolved suggesting accurate placement of the medication.   Advised to call if fevers/chills, erythema, induration, drainage, or persistent bleeding.   Images permanently stored and available for review in the ultrasound unit.  Impression: Technically successful ultrasound guided injection.         Lab and Radiology Results No results found for this or any previous visit (from the past 72 hour(s)). Mr Shoulder Right Wo Contrast  Result Date: 04/05/2019 CLINICAL DATA:  Right shoulder pain. Injured lifting weights last week. EXAM: MRI OF THE RIGHT SHOULDER WITHOUT CONTRAST TECHNIQUE: Multiplanar, multisequence MR imaging of the shoulder was performed. No intravenous contrast was administered. COMPARISON:  None. FINDINGS: Rotator cuff: Moderate tendinosis of the supraspinatus tendon with a 10 mm full-thickness tear of the anterior supraspinatus tendon. Infraspinatus tendon is intact. Teres minor tendon is intact. Mild tendinosis of the subscapularis tendon. Muscles: Mild atrophy of the teres minor muscle. Remainder the rotator cuff muscles are normal. Biceps long head:  Intact. Acromioclavicular Joint: Mild arthropathy of the acromioclavicular joint. Type II acromion. Small amount of subacromial/subdeltoid bursal fluid. Glenohumeral Joint: No joint effusion.  No chondral defect. Labrum: Grossly intact, but evaluation is limited by lack of intraarticular  fluid. Bones:  No acute osseous abnormality.  No aggressive osseous lesion. Other: No fluid collection or hematoma. IMPRESSION: 1.  Moderate tendinosis of the supraspinatus tendon with a 10 mm full-thickness tear of the anterior supraspinatus tendon. 2. Mild tendinosis of the subscapularis tendon. 3. Mild subacromial/subdeltoid bursitis. Electronically Signed   By: Elige Ko   On: 04/05/2019 11:07       Assessment and Plan: 63 y.o. male with right shoulder pain due to rotator cuff tendinitis and rotator cuff tear.  Plan for physical therapy and injection as above.  Additionally proceed with home exercise program.  Oxycodone refilled during previous encounter.  Recheck in 4 weeks.  Return sooner if needed.  I spent 25 minutes with this patient, greater than 50% was face-to-face time counseling regarding MRI findings differential diagnosis treatment plan and options as well as backup plan.Marland Kitchen    PDMP reviewed during this encounter. Orders Placed This Encounter  Procedures  . Ambulatory referral to Physical Therapy    Referral Priority:   Routine    Referral Type:   Physical Medicine    Referral Reason:   Specialty Services Required    Requested Specialty:   Physical Therapy   No orders of the defined types were placed in this encounter.   Historical information moved to improve visibility of documentation.  Past Medical History:  Diagnosis Date  . Hypertension    Past Surgical History:  Procedure Laterality Date  . ABDOMINAL SURGERY     Social History   Tobacco Use  . Smoking status: Current Every Day Smoker  . Smokeless tobacco: Never Used  Substance Use Topics  . Alcohol use: Yes    Alcohol/week: 0.0 standard drinks   family history includes Breast cancer in his maternal grandmother and sister.  Medications: Current Outpatient Medications  Medication Sig Dispense Refill  . atorvastatin (LIPITOR) 10 MG tablet Take 10 mg by mouth.    Marland Kitchen lisinopril (PRINIVIL,ZESTRIL) 10 MG tablet TAKE ONE (1) TABLET BY MOUTH EVERY DAY  11  . LORazepam (ATIVAN) 1 MG tablet TAKE ONE TABLET BY MOUTH TWICE EVERY DAY  -- AS NEEDED  0  . oxyCODONE-acetaminophen (PERCOCET/ROXICET) 5-325 MG tablet Take 1 tablet by mouth every 8 (eight) hours as needed for severe pain. 15 tablet 0   No current facility-administered medications for this visit.    Allergies  Allergen Reactions  . Sulfa Antibiotics       Discussed warning signs or symptoms. Please see discharge instructions. Patient expresses understanding.

## 2019-04-14 ENCOUNTER — Ambulatory Visit: Payer: 59 | Admitting: Rehabilitative and Restorative Service Providers"

## 2019-04-15 ENCOUNTER — Other Ambulatory Visit: Payer: Self-pay

## 2019-04-15 ENCOUNTER — Ambulatory Visit: Payer: 59 | Admitting: Rehabilitative and Restorative Service Providers"

## 2019-04-15 ENCOUNTER — Encounter: Payer: Self-pay | Admitting: Rehabilitative and Restorative Service Providers"

## 2019-04-15 DIAGNOSIS — M25511 Pain in right shoulder: Secondary | ICD-10-CM | POA: Diagnosis not present

## 2019-04-15 DIAGNOSIS — M6281 Muscle weakness (generalized): Secondary | ICD-10-CM | POA: Diagnosis not present

## 2019-04-15 DIAGNOSIS — R293 Abnormal posture: Secondary | ICD-10-CM

## 2019-04-15 DIAGNOSIS — R29898 Other symptoms and signs involving the musculoskeletal system: Secondary | ICD-10-CM | POA: Diagnosis not present

## 2019-04-15 NOTE — Therapy (Addendum)
[facility] [place] [address] [place], [place], [address] Phone: [phone]   Fax:  [phone]  Physical Therapy Evaluation  Patient Details  Name: Matthew Escobar MRN: [id number] Date of Birth: 24-Oct-1956 Referring Provider (PT): Dr Clementeen Graham    Encounter Date: 04/15/2019  PT End of Session - 04/15/19 1322    Visit Number  1    Number of Visits  12    Date for PT Re-Evaluation  05/27/19    PT Start Time  1100    PT Stop Time  1149    PT Time Calculation (min)  49 min    Activity Tolerance  Patient tolerated treatment well       Past Medical History:  Diagnosis Date  . Hypertension     Past Surgical History:  Procedure Laterality Date  . ABDOMINAL SURGERY     MRI:  Moderate tendinosis of the supraspinatus tendon with a 10 mm full-thickness tear of the anterior supraspinatus tendon. 2. Mild tendinosis of the subscapularis tendon. 3. Mild subacromial/subdeltoid bursitis   There were no vitals filed for this visit.   Subjective Assessment - 04/15/19 1109    Subjective  Patient reports feeling "a snap" and pain when lifting 25 # wt with biceps hammer curl 03/26/2019. He has had continued pain in Rt shoulder since that time. He underwent showing partial RC tear     Pertinent History  colon surgeries; history of LBP    Diagnostic tests  MRI     Patient Stated Goals  keep from having to have surgery - get arm to go out and up (return full ROM)     Currently in Pain?  Yes    Pain Score  7     Pain Location  Shoulder    Pain Orientation  Right    Pain Descriptors / Indicators  Sharp;Nagging    Pain Type  Acute pain    Pain Radiating Towards  into bicpes and shoulder blade on Rt     Pain Onset  1 to 4 weeks ago    Pain Frequency  Intermittent   constant during the day    Aggravating Factors   moving Rt arm; lifting; reaching     Pain Relieving Factors  injection; meds; rest; sleep          [facility] PT Assessment  - 04/15/19 0001      Assessment   Medical Diagnosis  Rt RC tear     Referring Provider (PT)  Dr Clementeen Graham     Onset Date/Surgical Date  03/26/19    Hand Dominance  Left    Next MD Visit  05/12/2019    Prior Therapy  here for LBP       Precautions   Precautions  None      Balance Screen   Has the patient fallen in the past 6 months  No    Has the patient had a decrease in activity level because of a fear of falling?   No    Is the patient reluctant to leave their home because of a fear of falling?   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; reaching; holding; UE use     Leisure  yard work; Pharmacologist; cooking; household chores       Observation/Other Assessments  Focus on Therapeutic Outcomes (FOTO)   56% limitation       Sensation   Additional Comments  WFL's per pt report       Posture/Postural Control   Posture Comments  head forward; shoulders rounded and elevated; head of the humerus anterior in orientation; scapulae abducted and rotated along the thoracic wall       AROM   Right/Left Shoulder  --   pain w/ AROM Rt shoulder    Right Shoulder Extension  37 Degrees    Right Shoulder Flexion  147 Degrees    Right Shoulder ABduction  110 Degrees    Right Shoulder Internal Rotation  24 Degrees    Right Shoulder External Rotation  68 Degrees    Left Shoulder Extension  66 Degrees    Left Shoulder Flexion  157 Degrees    Left Shoulder ABduction  158 Degrees    Left Shoulder Internal Rotation  39 Degrees    Left Shoulder External Rotation  90 Degrees      Strength   Right/Left Shoulder  --   Lt shoulder 5/5 - pain Rt w/ ER/flex/abd    Right Shoulder Flexion  4-/5    Right Shoulder Extension  5/5    Right Shoulder ABduction  4/5    Right Shoulder Internal Rotation  4+/5    Right Shoulder External Rotation  4-/5      Palpation   Palpation  comment  muscular tightness through the Rt shoulder girdle - pecs; upper trap; leveator; anterior GH joint                 Objective measurements completed on examination: See above findings.      [facility] Adult PT Treatment/Exercise - 04/15/19 0001      Therapeutic Activites    Therapeutic Activities  --   yofacial ball release work      Neuro Re-ed    Neuro Re-ed Details   iniitated postural re-education - working on engaging posterior shoulder girdle       Shoulder Exercises: Standing   Other Standing Exercises  scap squeeze 10 sec x 10; axial extensin 10 sec x 5; scap squeeze w/ bilat UE's in slight ER elbows flexed 90 deg x10 - all ex with noodle along spine       Shoulder Exercises: Pulleys   Flexion  --   10 sec hold x 5 reps    Scaption  --   10 sec hold x 5 reps      Shoulder Exercises: Stretch   External Rotation Stretch  5 reps;10 seconds   standing using cane to assist with ER no pain    Other Shoulder Stretches  step back stretch for shoulder flexion 10 sec hold x 5 reps hands on counter              PT Education - 04/15/19 1146    Education Details  HEP TENS     Person(s) Educated  Patient    Methods  Explanation;Demonstration;Tactile cues;Verbal cues;Handout    Comprehension  Verbalized understanding;Returned demonstration;Verbal cues required;Tactile cues required          PT Long Term Goals - 04/15/19 1331      PT LONG TERM GOAL #1   Title  Patient I in HEP at discharge 05/27/2019    Time  6    Period  Weeks    Status  New      PT LONG TERM GOAL #2   Title  4+/5 to 5/5 strength Rt shoulder 05/27/2019    Time  6    Period  Weeks    Status  New      PT LONG TERM GOAL #3   Title  Increase AROM Rt shoulder to equal or greater that AROM Lt shoulder 05/27/2019    Time  6    Period  Weeks      PT LONG TERM GOAL #4   Title  Patient reports return to normal functional use of Rt UE including lifting up to 10# without pain greater than  0/10 to 2/10 at greatest 05/27/2019    Time  6    Period  Weeks    Status  New      PT LONG TERM GOAL #5   Title  Improve FOTO to </= 33% limitation 05/27/2019    Time  6    Period  Weeks    Status  New             Plan - 04/15/19 1147    Clinical Impression Statement  Joe presents with Rt shoulder pain, limited ROM and weakness following injury 03/26/2019 with MRI confirming partial thichness RC tear. Patient has poor posture and alignment, limited ROM, decreased strength; pain with resistive testing and functional activities; decreased functional use Rt UE. Patient will benefit from PT to address problems identified.     Personal Factors and Comorbidities  Comorbidity 1;Comorbidity 2;Fitness    Comorbidities  HTN; obesity    Examination-Activity Limitations  Lift;Reach Overhead;Carry    Examination-Participation Restrictions  Yard Work;Cleaning    Stability/Clinical Decision Making  Evolving/Moderate complexity    Clinical Decision Making  Moderate    Rehab Potential  Good    PT Frequency  2x / week    PT Duration  6 weeks    PT Treatment/Interventions  Patient/family education;ADLs/Self Care Home Management;Cryotherapy;Electrical Stimulation;Iontophoresis 4mg /ml Dexamethasone;Moist Heat;Ultrasound;Dry needling;Manual techniques;Therapeutic activities;Therapeutic exercise;Neuromuscular re-education    PT Next Visit Plan  review HEP; progress with gentle stretching Rt shoulder; progress with active exercise Rt UE (SL ER, standing scaption, incline slide Rt UE); manual work; PROM; modalities as indicated     PT Home Exercise Plan  [email or web address]    Consulted and Agree with Plan of Care  Patient       Patient will benefit from skilled therapeutic intervention in order to improve the following deficits and impairments:  Postural dysfunction, Improper body mechanics, Pain, Increased fascial restricitons, Increased muscle spasms, Decreased strength, Decreased range of motion, Decreased  activity tolerance, Impaired UE functional use  Visit Diagnosis: Acute pain of right shoulder - Plan: PT plan of care cert/re-cert  Other symptoms and signs involving the musculoskeletal system - Plan: PT plan of care cert/re-cert  Abnormal posture - Plan: PT plan of care cert/re-cert  Muscle weakness (generalized) - Plan: PT plan of care cert/re-cert     Problem List Patient Active Problem List   Diagnosis Date Noted  . Traumatic incomplete tear of right rotator cuff 04/08/2019  . Lumbosacral strain 03/18/2017    Celyn [name] PT, MPH  04/15/2019, 1:37 PM  [facility] [address] [place], [place], [address] Phone: [phone]   Fax:  [phone]  Name: Josaih Zurcher MRN: [id number] Date of Birth: 07-26-1956

## 2019-04-15 NOTE — Patient Instructions (Signed)
Access Code: [email or web address]  URL: [email or web address]  Date: 04/15/2019  Prepared by: Corlis Leak   Exercises  Seated Cervical Retraction - 10 reps - 1 sets - 3x daily - 7x weekly  Standing Scapular Retraction - 10 reps - 1 sets - 10 hold - 3x daily - 7x weekly  Shoulder External Rotation and Scapular Retraction - 10 reps - 1 sets - hold - 3x daily - 7x weekly  Seated Shoulder Flexion AAROM with Pulley Behind - 10 reps - 1 sets - 10 sec hold - 2x daily - 7x weekly  Standing 'L' Stretch at Counter - 3-5 reps - 1 sets - 5-10 sec hold - 2x daily - 7x weekly  Standing Shoulder External Rotation AAROM with Dowel - 5 reps - 1 sets - 10 sec hold - 2x daily - 7x weekly

## 2019-04-20 ENCOUNTER — Ambulatory Visit: Payer: 59 | Admitting: Rehabilitative and Restorative Service Providers"

## 2019-04-20 ENCOUNTER — Other Ambulatory Visit: Payer: Self-pay

## 2019-04-20 ENCOUNTER — Encounter: Payer: Self-pay | Admitting: Rehabilitative and Restorative Service Providers"

## 2019-04-20 DIAGNOSIS — R29898 Other symptoms and signs involving the musculoskeletal system: Secondary | ICD-10-CM | POA: Diagnosis not present

## 2019-04-20 DIAGNOSIS — M25511 Pain in right shoulder: Secondary | ICD-10-CM | POA: Diagnosis not present

## 2019-04-20 DIAGNOSIS — R293 Abnormal posture: Secondary | ICD-10-CM

## 2019-04-20 DIAGNOSIS — M6281 Muscle weakness (generalized): Secondary | ICD-10-CM | POA: Diagnosis not present

## 2019-04-20 NOTE — Therapy (Addendum)
[facility] [place] [address] [place], [place], [address] Phone: [phone]   Fax:  [phone]  Physical Therapy Evaluation  Patient Details  Name: Matthew Escobar MRN: [id number] Date of Birth: 06/22/1956 Referring Provider (PT): Dr Clementeen Graham    Encounter Date: 04/20/2019  PT End of Session - 04/20/19 1058    Visit Number  2    Number of Visits  12    Date for PT Re-Evaluation  05/27/19    PT Start Time  1058    PT Stop Time  1150    PT Time Calculation (min)  52 min    Activity Tolerance  Patient tolerated treatment well       Past Medical History:  Diagnosis Date  . Hypertension     Past Surgical History:  Procedure Laterality Date  . ABDOMINAL SURGERY      There were no vitals filed for this visit.   Subjective Assessment - 04/20/19 1059    Subjective  Patient reports that he unloaded the boat Saturday and noted pain afterwards. Working on the exercises at home.     Currently in Pain?  Yes    Pain Score  4     Pain Location  Shoulder    Pain Orientation  Right    Pain Descriptors / Indicators  Nagging    Pain Type  Acute pain                    Objective measurements completed on examination: See above findings.      [facility] Adult PT Treatment/Exercise - 04/20/19 0001      Shoulder Exercises: Supine   Other Supine Exercises  scap retraction 10 sec x 10     Other Supine Exercises  rhythmic stabilization flex/ext; horizontal ab/add; circles CW/CCW  - no pain       Shoulder Exercises: Sidelying   External Rotation  AROM;Strengthening;Right;10 reps   partial range - no pain      Shoulder Exercises: Standing   Row  Strengthening;Both;10 reps;Theraband    Theraband Level (Shoulder Row)  Level 2 (Red)    Other Standing Exercises  scap squeeze 10 sec x 10; scap squeeze w/ bilat UE's in slight ER elbows flexed 90 deg x10 - all ex with noodle along spine     Other Standing Exercises  shoulder  scaption 75-80 deg x 10 no pain       Shoulder Exercises: Pulleys   Flexion  --   10 sec hold x 5 reps    Scaption  --   10 sec hold x 5 reps      Shoulder Exercises: Stretch   External Rotation Stretch  5 reps;10 seconds   standing using cane to assist with ER no pain      Manual Therapy   Manual therapy comments  pt supine     Joint Mobilization  joint glides     Soft tissue mobilization  deep tissue work through the pecs; deltiod; biceps area Rt shoulder     Passive ROM  PROM Rt shoulder flexion; scaption; ER in scaption              PT Education - 04/20/19 1143    Education Details  HEP     Person(s) Educated  Patient    Methods  Explanation;Demonstration;Tactile cues;Verbal cues;Handout    Comprehension  Verbalized understanding;Returned demonstration;Verbal cues required;Tactile cues required          PT  Long Term Goals - 04/15/19 1331      PT LONG TERM GOAL #1   Title  Patient I in HEP at discharge 05/27/2019    Time  6    Period  Weeks    Status  New      PT LONG TERM GOAL #2   Title  4+/5 to 5/5 strength Rt shoulder 05/27/2019    Time  6    Period  Weeks    Status  New      PT LONG TERM GOAL #3   Title  Increase AROM Rt shoulder to equal or greater that AROM Lt shoulder 05/27/2019    Time  6    Period  Weeks      PT LONG TERM GOAL #4   Title  Patient reports return to normal functional use of Rt UE including lifting up to 10# without pain greater than 0/10 to 2/10 at greatest 05/27/2019    Time  6    Period  Weeks    Status  New      PT LONG TERM GOAL #5   Title  Improve FOTO to </= 33% limitation 05/27/2019    Time  6    Period  Weeks    Status  New        Assessment:  Added exercise and manual work without difficulty - patient tolerated all exercises as well as manual work and [employer]AROM with no c/o pain. Patient was encouraged to continue with HEP.     Plan - 04/20/19 1058    Personal Factors and Comorbidities  Comorbidity 1;Comorbidity  2;Fitness    Comorbidities  HTN; obesity    Examination-Activity Limitations  Lift;Reach Overhead;Carry    Examination-Participation Restrictions  Yard Work;Cleaning    Stability/Clinical Decision Making  Evolving/Moderate complexity    Rehab Potential  Good    PT Frequency  2x / week    PT Duration  6 weeks    PT Treatment/Interventions  Patient/family education;ADLs/Self Care Home Management;Cryotherapy;Electrical Stimulation;Iontophoresis 4mg /ml Dexamethasone;Moist Heat;Ultrasound;Dry needling;Manual techniques;Therapeutic activities;Therapeutic exercise;Neuromuscular re-education    PT Next Visit Plan  review HEP; progress with gentle stretching Rt shoulder; progress with active exercise Rt UE (SL ER, standing scaption, incline slide Rt UE); manual work; PROM; modalities as indicated     PT Home Exercise Plan  [email or web address]    Consulted and Agree with Plan of Care  Patient       Patient will benefit from skilled therapeutic intervention in order to improve the following deficits and impairments:  Postural dysfunction, Improper body mechanics, Pain, Increased fascial restricitons, Increased muscle spasms, Decreased strength, Decreased range of motion, Decreased activity tolerance, Impaired UE functional use  Visit Diagnosis: Acute pain of right shoulder  Other symptoms and signs involving the musculoskeletal system  Abnormal posture  Muscle weakness (generalized)     Problem List Patient Active Problem List   Diagnosis Date Noted  . Traumatic incomplete tear of right rotator cuff 04/08/2019  . Lumbosacral strain 03/18/2017    Jamine Wingate Rober MinionP Shawnta Zimbelman PT, MPH  04/20/2019, 12:05 PM  [facility] [address] [place], [place], [address] Phone: [phone]   Fax:  [phone]  Name: Matthew Escobar MRN: [id number] Date of Birth: Jul 18, 1956

## 2019-04-20 NOTE — Patient Instructions (Signed)
Access Code: [email or web address]  URL: [email or web address]  Date: 04/20/2019  Prepared by: Corlis Leak   Exercises  Seated Cervical Retraction - 10 reps - 1 sets - 3x daily - 7x weekly  Standing Scapular Retraction - 10 reps - 1 sets - 10 hold - 3x daily - 7x weekly  Shoulder External Rotation and Scapular Retraction - 10 reps - 1 sets - hold - 3x daily - 7x weekly  Seated Shoulder Flexion AAROM with Pulley Behind - 10 reps - 1 sets - 10 sec hold - 2x daily - 7x weekly  Standing 'L' Stretch at Counter - 3-5 reps - 1 sets - 5-10 sec hold - 2x daily - 7x weekly  Standing Shoulder External Rotation AAROM with Dowel - 5 reps - 1 sets - 10 sec hold - 2x daily - 7x weekly   Today  Sidelying Shoulder External Rotation - 10 reps - 1 sets - 2-3 sec hold - 2x daily - 7x weekly  Standing Shoulder Scaption - 10 reps - 1 sets - 2 sec hold - 2x daily - 7x weekly  Supine Alternating Shoulder Flexion - 10 reps - 1-2 sets - 2x daily - 7x weekly  Supine Scapular Retraction - 10 reps - 2-3 sets - 10 sec hold - 2x daily - 7x weekly  Standing Row with Resistance - 10 reps - 1-2 sets - 2 sec hold - 2x daily - 7x weekly

## 2019-04-24 ENCOUNTER — Encounter: Payer: Self-pay | Admitting: Rehabilitative and Restorative Service Providers"

## 2019-04-24 ENCOUNTER — Ambulatory Visit: Payer: 59 | Admitting: Rehabilitative and Restorative Service Providers"

## 2019-04-24 ENCOUNTER — Other Ambulatory Visit: Payer: Self-pay

## 2019-04-24 DIAGNOSIS — M6281 Muscle weakness (generalized): Secondary | ICD-10-CM

## 2019-04-24 DIAGNOSIS — R293 Abnormal posture: Secondary | ICD-10-CM

## 2019-04-24 DIAGNOSIS — R29898 Other symptoms and signs involving the musculoskeletal system: Secondary | ICD-10-CM | POA: Diagnosis not present

## 2019-04-24 DIAGNOSIS — M25511 Pain in right shoulder: Secondary | ICD-10-CM | POA: Diagnosis not present

## 2019-04-24 NOTE — Patient Instructions (Signed)
Access Code: [email or web address]  URL: [email or web address]  Date: 04/24/2019  Prepared by: Corlis Leak   Exercises  Seated Cervical Retraction - 10 reps - 1 sets - 3x daily - 7x weekly  Standing Scapular Retraction - 10 reps - 1 sets - 10 hold - 3x daily - 7x weekly  Shoulder External Rotation and Scapular Retraction - 10 reps - 1 sets - hold - 3x daily - 7x weekly  Seated Shoulder Flexion AAROM with Pulley Behind - 10 reps - 1 sets - 10 sec hold - 2x daily - 7x weekly  Standing 'L' Stretch at Counter - 3-5 reps - 1 sets - 5-10 sec hold - 2x daily - 7x weekly  Standing Shoulder External Rotation AAROM with Dowel - 5 reps - 1 sets - 10 sec hold - 2x daily - 7x weekly  Sidelying Shoulder External Rotation - 10 reps - 1 sets - 2-3 sec hold - 2x daily - 7x weekly  Standing Shoulder Scaption - 10 reps - 1 sets - 2 sec hold - 2x daily - 7x weekly  Supine Alternating Shoulder Flexion - 10 reps - 1-2 sets - 2x daily - 7x weekly  Supine Scapular Retraction - 10 reps - 2-3 sets - 10 sec hold - 2x daily - 7x weekly  Standing Row with Resistance - 10 reps - 1-2 sets - 2 sec hold - 2x daily - 7x weekly  Isometric Shoulder Extension at Wall - 3 reps - 1 sets - 30 sec hold - 2x daily - 7x weekly  Isometric Shoulder Flexion at Wall - 3 reps - 1 sets - 30 sec hold - 2x daily - 7x weekly

## 2019-04-24 NOTE — Therapy (Signed)
[facility] [address] [place], [place], [address] Phone: [phone]   Fax:  [phone]  Physical Therapy Treatment  Patient Details  Name: Matthew Escobar MRN: [id number] Date of Birth: 1956/07/02 Referring Provider (PT): Dr Clementeen GrahamEvan Corey    Encounter Date: 04/24/2019  PT End of Session - 04/24/19 1057    Visit Number  3    Number of Visits  12    Date for PT Re-Evaluation  05/27/19    PT Start Time  1057    PT Stop Time  1155    PT Time Calculation (min)  58 min    Activity Tolerance  Patient tolerated treatment well       Past Medical History:  Diagnosis Date  . Hypertension     Past Surgical History:  Procedure Laterality Date  . ABDOMINAL SURGERY      There were no vitals filed for this visit.  Subjective Assessment - 04/24/19 1058    Subjective  Increased pain since therapy Monday. Flared up the pain with something we did in therapy. Has not done the exercises since Monday. Having pain in the Lt shoulder area related to overusing Lt to compensate for Rt     Currently in Pain?  Yes    Pain Score  4    up to 8/10 at times    Pain Location  Shoulder    Pain Orientation  Right    Pain Descriptors / Indicators  Aching                       [facility] Adult PT Treatment/Exercise - 04/24/19 0001      Shoulder Exercises: Seated   Other Seated Exercises  activation of deltoid - lifting ~ 1 inch elbow and forearm x 10 x 2 sets UE supported on pillow       Shoulder Exercises: Standing   Other Standing Exercises  scap squeeze 10 sec x 10; scap squeeze w/ bilat UE's in slight ER elbows flexed 90 deg x10 - all ex with noodle along spine       Shoulder Exercises: Pulleys   Flexion  --   10 sec hold x 5 reps      Shoulder Exercises: Isometric Strengthening   Flexion  --   5 sec x 10 - limited to no pain    Extension  --   5 sec hold x 10 - no pain      Shoulder Exercises: Stretch   Other  Shoulder Stretches  biceps stretch 20-30 sec x 2 reps       Cryotherapy   Number Minutes Cryotherapy  15 Minutes    Cryotherapy Location  Shoulder   Lt    Type of Cryotherapy  Ice pack      Electrical Stimulation   Electrical Stimulation Location  Rt shoulder    Electrical Stimulation Action  IFC    Electrical Stimulation Parameters  to tolerance    Electrical Stimulation Goals  Pain;Tone      Vasopneumatic   Number Minutes Vasopneumatic   15 minutes    Vasopnuematic Location   Shoulder    Vasopneumatic Pressure  Low    Vasopneumatic Temperature   34      Manual Therapy   Kinesiotex  Inhibit Muscle      Kinesiotix   Inhibit Muscle   I strip regular kinesotape anterior shoudler biceps/deltoid; posterior shoulder Rt - 2 strips  and tirceps to middle  deltiod Lt shoulder x 1 strip              PT Education - 04/24/19 1217    Education Details  HEP     Person(s) Educated  Patient    Methods  Explanation;Demonstration;Tactile cues;Verbal cues;Handout    Comprehension  Verbalized understanding;Returned demonstration;Verbal cues required;Tactile cues required          PT Long Term Goals - 04/15/19 1331      PT LONG TERM GOAL #1   Title  Patient I in HEP at discharge 05/27/2019    Time  6    Period  Weeks    Status  New      PT LONG TERM GOAL #2   Title  4+/5 to 5/5 strength Rt shoulder 05/27/2019    Time  6    Period  Weeks    Status  New      PT LONG TERM GOAL #3   Title  Increase AROM Rt shoulder to equal or greater that AROM Lt shoulder 05/27/2019    Time  6    Period  Weeks      PT LONG TERM GOAL #4   Title  Patient reports return to normal functional use of Rt UE including lifting up to 10# without pain greater than 0/10 to 2/10 at greatest 05/27/2019    Time  6    Period  Weeks    Status  New      PT LONG TERM GOAL #5   Title  Improve FOTO to </= 33% limitation 05/27/2019    Time  6    Period  Weeks    Status  New            Plan - 04/24/19  1058    Clinical Impression Statement  Increased pain possibly from therapy session in addition to patient using Rt UE for functional activities including pushing up from the ground and closing the truck door with Rt UE. He has soreness in the Lt shoulder as well. Feels he is overusing the Lt arm. Modified exercises to avoid any exercises that irritate the symptoms. Patient encouraged to avoid activities that cause pain and try using TENS unit and cold pack at home. Patient needs to decrease pain.     Personal Factors and Comorbidities  Comorbidity 1;Comorbidity 2;Fitness    Comorbidities  HTN; obesity    Examination-Activity Limitations  Lift;Reach Overhead;Carry    Examination-Participation Restrictions  Yard Work;Cleaning    Stability/Clinical Decision Making  Evolving/Moderate complexity    Rehab Potential  Good    PT Frequency  2x / week    PT Duration  6 weeks    PT Treatment/Interventions  Patient/family education;ADLs/Self Care Home Management;Cryotherapy;Electrical Stimulation;Iontophoresis 4mg /ml Dexamethasone;Moist Heat;Ultrasound;Dry needling;Manual techniques;Therapeutic activities;Therapeutic exercise;Neuromuscular re-education    PT Next Visit Plan  review HEP; progress with gentle stretching Rt shoulder; progress with active exercise Rt UE (SL ER, standing scaption, incline slide Rt UE); manual work; PROM; modalities as indicated - assess response to change in exercise plan.     PT Home Exercise Plan  [id number]    Consulted and Agree with Plan of Care  Patient       Patient will benefit from skilled therapeutic intervention in order to improve the following deficits and impairments:  Postural dysfunction, Improper body mechanics, Pain, Increased fascial restricitons, Increased muscle spasms, Decreased strength, Decreased range of motion, Decreased activity tolerance, Impaired UE functional use  Visit Diagnosis: Acute pain of right shoulder  Other symptoms and signs involving the  musculoskeletal system  Abnormal posture  Muscle weakness (generalized)     Problem List Patient Active Problem List   Diagnosis Date Noted  . Traumatic incomplete tear of right rotator cuff 04/08/2019  . Lumbosacral strain 03/18/2017    Dilon Lank [name] PT, MPH  04/24/2019, 12:22 PM  [facility] [address] [place], [place], [address] Phone: [phone]   Fax:  [phone]  Name: Matthew Escobar MRN: [id number] Date of Birth: December 14, 1956

## 2019-04-27 ENCOUNTER — Ambulatory Visit: Payer: 59 | Admitting: Rehabilitative and Restorative Service Providers"

## 2019-04-27 ENCOUNTER — Encounter: Payer: Self-pay | Admitting: Rehabilitative and Restorative Service Providers"

## 2019-04-27 ENCOUNTER — Other Ambulatory Visit: Payer: Self-pay

## 2019-04-27 DIAGNOSIS — R293 Abnormal posture: Secondary | ICD-10-CM

## 2019-04-27 DIAGNOSIS — R29898 Other symptoms and signs involving the musculoskeletal system: Secondary | ICD-10-CM | POA: Diagnosis not present

## 2019-04-27 DIAGNOSIS — M25511 Pain in right shoulder: Secondary | ICD-10-CM | POA: Diagnosis not present

## 2019-04-27 DIAGNOSIS — M6281 Muscle weakness (generalized): Secondary | ICD-10-CM | POA: Diagnosis not present

## 2019-04-27 NOTE — Therapy (Signed)
[facility] [place] [address] [place] [address] [place], [place], [address] Phone: [phone]   Fax:  [phone]  Physical Therapy Treatment  Patient Details  Name: Matthew Escobar MRN: [id number] Date of Birth: [birth year] Referring Provider (PT): Dr Clementeen Graham    Encounter Date: 04/27/2019  PT End of Session - 04/27/19 0805    Visit Number  4    Number of Visits  12    Date for PT Re-Evaluation  05/27/19    PT Start Time  0805    PT Stop Time  0858    PT Time Calculation (min)  53 min       Past Medical History:  Diagnosis Date  . Hypertension     Past Surgical History:  Procedure Laterality Date  . ABDOMINAL SURGERY      There were no vitals filed for this visit.  Subjective Assessment - 04/27/19 0807    Subjective  Patient reports that he has less pain today. Shoulder has calmed down some. Has some discomfort in the back side of his arm. Good response to modalities and taping from last visit.     Currently in Pain?  No/denies                       [facility] Adult PT Treatment/Exercise - 04/27/19 0001      Shoulder Exercises: Seated   Other Seated Exercises  activation of deltoid - lifting ~ 1 inch elbow and forearm x 10 x 2 sets UE supported on pillow     Other Seated Exercises  stabilization and activation of shoulder/scapular musculature using ball on wall and dowel working on small controlled movement.       Shoulder Exercises: Pulleys   Flexion  --   10 sec hold x 5 reps      Shoulder Exercises: Therapy Ball   Flexion Limitations  small ball on wall - flex/ext; horizontal ab/ad; circles CW/CCW     Other Therapy Ball Exercises  scapular depression with ball 5 sec x 10       Shoulder Exercises: Isometric Strengthening   Flexion  --   5 sec x 10 - limited to no pain    Extension  --   5 sec hold x 10 - no pain    ABduction  --   5 sec hold x 10 reps no pain elbow 90 deg      Cryotherapy   Number  Minutes Cryotherapy  15 Minutes    Cryotherapy Location  Shoulder   Lt    Type of Cryotherapy  Ice pack      Electrical Stimulation   Electrical Stimulation Location  Rt shoulder    Electrical Stimulation Action  IFC    Electrical Stimulation Parameters  to tolerance     Electrical Stimulation Goals  Pain;Tone      Vasopneumatic   Number Minutes Vasopneumatic   15 minutes    Vasopnuematic Location   Shoulder    Vasopneumatic Pressure  Low    Vasopneumatic Temperature   34      Manual Therapy   Kinesiotex  --   tape in place pt will remove this week to tape Fri                 PT Long Term Goals - 04/15/19 1331      PT LONG TERM GOAL #1   Title  Patient I in HEP at discharge 05/27/2019  Time  6    Period  Weeks    Status  New      PT LONG TERM GOAL #2   Title  4+/5 to 5/5 strength Rt shoulder 05/27/2019    Time  6    Period  Weeks    Status  New      PT LONG TERM GOAL #3   Title  Increase AROM Rt shoulder to equal or greater that AROM Lt shoulder 05/27/2019    Time  6    Period  Weeks      PT LONG TERM GOAL #4   Title  Patient reports return to normal functional use of Rt UE including lifting up to 10# without pain greater than 0/10 to 2/10 at greatest 05/27/2019    Time  6    Period  Weeks    Status  New      PT LONG TERM GOAL #5   Title  Improve FOTO to </= 33% limitation 05/27/2019    Time  6    Period  Weeks    Status  New            Plan - 04/27/19 [id number]    Clinical Impression Statement  Less pain with decreased exercises and activities. Good response to taping and patient wants to leave tape in place today. He will remove the tape this week so we can tape again Friday. Added stabilization exercises and activities with no pain today. Continued education re- shoulder rehab process with partial tear of RC. Will continue to assess response to exercise and progress as tolerated.    Personal Factors and Comorbidities  Comorbidity 1;Comorbidity  2;Fitness    Comorbidities  HTN; obesity    Examination-Activity Limitations  Lift;Reach Overhead;Carry    Examination-Participation Restrictions  Yard Work;Cleaning    Stability/Clinical Decision Making  Evolving/Moderate complexity    Rehab Potential  Good    PT Frequency  2x / week    PT Duration  6 weeks    PT Treatment/Interventions  Patient/family education;ADLs/Self Care Home Management;Cryotherapy;Electrical Stimulation;Iontophoresis 4mg /ml Dexamethasone;Moist Heat;Ultrasound;Dry needling;Manual techniques;Therapeutic activities;Therapeutic exercise;Neuromuscular re-education    PT Next Visit Plan  review HEP; progress with gentle stretching Rt shoulder; progress with active exercise Rt UE (SL ER, standing scaption, incline slide Rt UE); manual work; PROM; modalities as indicated - assess response to change in exercise plan.     PT Home Exercise Plan  [id number]    Consulted and Agree with Plan of Care  Patient       Patient will benefit from skilled therapeutic intervention in order to improve the following deficits and impairments:  Postural dysfunction, Improper body mechanics, Pain, Increased fascial restricitons, Increased muscle spasms, Decreased strength, Decreased range of motion, Decreased activity tolerance, Impaired UE functional use  Visit Diagnosis: Acute pain of right shoulder  Other symptoms and signs involving the musculoskeletal system  Abnormal posture  Muscle weakness (generalized)     Problem List Patient Active Problem List   Diagnosis Date Noted  . Traumatic incomplete tear of right rotator cuff 04/08/2019  . Lumbosacral strain 03/18/2017    Lamaya Hyneman Rober MinionP Nea Gittens PT, MPH  04/27/2019, 8:54 AM  [facility] [address] [place], [place], [address] Phone: [phone]   Fax:  [phone]  Name: Jacqulyn DuckingJoseph Been MRN: [id number] Date of Birth: Aug 14, 1956

## 2019-05-01 ENCOUNTER — Other Ambulatory Visit: Payer: Self-pay

## 2019-05-01 ENCOUNTER — Ambulatory Visit: Payer: 59 | Admitting: Rehabilitative and Restorative Service Providers"

## 2019-05-01 ENCOUNTER — Encounter: Payer: Self-pay | Admitting: Rehabilitative and Restorative Service Providers"

## 2019-05-01 DIAGNOSIS — M25511 Pain in right shoulder: Secondary | ICD-10-CM | POA: Diagnosis not present

## 2019-05-01 DIAGNOSIS — R293 Abnormal posture: Secondary | ICD-10-CM

## 2019-05-01 DIAGNOSIS — R29898 Other symptoms and signs involving the musculoskeletal system: Secondary | ICD-10-CM

## 2019-05-01 DIAGNOSIS — M6281 Muscle weakness (generalized): Secondary | ICD-10-CM

## 2019-05-01 NOTE — Therapy (Signed)
[facility] [place] [address] [place], [place], [address] Phone: [phone]   Fax:  [phone]  Physical Therapy Treatment  Patient Details  Name: Matthew Escobar MRN: [id number] Date of Birth: 14-Jul-1956 Referring Provider (PT): Dr Clementeen Graham    Encounter Date: 05/01/2019  PT End of Session - 05/01/19 1108    Visit Number  5    Number of Visits  12    Date for PT Re-Evaluation  05/27/19    PT Start Time  1108    PT Stop Time  1158    PT Time Calculation (min)  50 min    Activity Tolerance  Patient tolerated treatment well       Past Medical History:  Diagnosis Date  . Hypertension     Past Surgical History:  Procedure Laterality Date  . ABDOMINAL SURGERY      There were no vitals filed for this visit.  Subjective Assessment - 05/01/19 1109    Subjective  Patient reports that he was doing well until he reached forward and to the side this morning  Having a bit more pain in the past few hours. Patient has been more careful with his movement and use of the Rt UE.     Currently in Pain?  Yes    Pain Score  4     Pain Location  Shoulder    Pain Orientation  Right    Pain Descriptors / Indicators  Aching    Pain Type  Acute pain         [facility] PT Assessment - 05/01/19 0001      Assessment   Medical Diagnosis  Rt RC tear     Referring Provider (PT)  Dr Clementeen Graham     Onset Date/Surgical Date  03/26/19    Hand Dominance  Left    Next MD Visit  05/12/2019    Prior Therapy  here for LBP       AROM   Right/Left Shoulder  --   pain with ER; IR; abduction    Right Shoulder Extension  58 Degrees    Right Shoulder Flexion  155 Degrees    Right Shoulder ABduction  157 Degrees    Right Shoulder Internal Rotation  29 Degrees    Right Shoulder External Rotation  57 Degrees                   [facility] Adult PT Treatment/Exercise - 05/01/19 0001      Shoulder Exercises: Standing   Retraction   Strengthening;Both;10 reps;Theraband    Theraband Level (Shoulder Retraction)  Level 1 (Yellow)   isometric contractioin w/ 2-3 sec hold    Retraction Limitations  bent forward row in neutral 3# x 15; triceps kick back 3# x 15     Shoulder Elevation Limitations  biceps curl with noodle along spine 3# bilat PT providing verbal and tactile cues for scapular position x 15 reps     Other Standing Exercises  scap squeeze 10 sec x 10; scap squeeze w/ bilat UE's in slight ER elbows flexed 90 deg x10 - all ex with noodle along spine     Other Standing Exercises  shoulder scaption 75-80 deg x 10 no pain       Shoulder Exercises: Pulleys   Flexion  --   10 sec hold x 5 reps      Shoulder Exercises: Therapy Ball   Flexion Limitations  small ball on wall - flex/ext;  horizontal ab/ad; circles CW/CCW     Other Therapy Ball Exercises  scapular depression with ball 5 sec x 10       Shoulder Exercises: Isometric Strengthening   Other Isometric Exercises  reverse wall push up x 10       Electrical Stimulation   Electrical Stimulation Location  Rt shoulder    Electrical Stimulation Action  IFC    Electrical Stimulation Parameters  to tolerance    Electrical Stimulation Goals  Pain;Tone      Vasopneumatic   Number Minutes Vasopneumatic   15 minutes    Vasopnuematic Location   Shoulder    Vasopneumatic Pressure  Low    Vasopneumatic Temperature   34                  PT Long Term Goals - 04/15/19 1331      PT LONG TERM GOAL #1   Title  Patient I in HEP at discharge 05/27/2019    Time  6    Period  Weeks    Status  New      PT LONG TERM GOAL #2   Title  4+/5 to 5/5 strength Rt shoulder 05/27/2019    Time  6    Period  Weeks    Status  New      PT LONG TERM GOAL #3   Title  Increase AROM Rt shoulder to equal or greater that AROM Lt shoulder 05/27/2019    Time  6    Period  Weeks      PT LONG TERM GOAL #4   Title  Patient reports return to normal functional use of Rt UE including  lifting up to 10# without pain greater than 0/10 to 2/10 at greatest 05/27/2019    Time  6    Period  Weeks    Status  New      PT LONG TERM GOAL #5   Title  Improve FOTO to </= 33% limitation 05/27/2019    Time  6    Period  Weeks    Status  New            Plan - 05/01/19 1108    Clinical Impression Statement  Continued intermittent pain related to movement of Rt UE of activities using Rt UE. Overall pain well controlled this week. Added gentle resistive exercises without difficulty. Good gains in ROM. Progressing gradually with rehab.     Personal Factors and Comorbidities  Comorbidity 1;Comorbidity 2;Fitness    Comorbidities  HTN; obesity    Examination-Activity Limitations  Lift;Reach Overhead;Carry    Examination-Participation Restrictions  Yard Work;Cleaning    Stability/Clinical Decision Making  Evolving/Moderate complexity    Rehab Potential  Good    PT Frequency  2x / week    PT Duration  6 weeks    PT Treatment/Interventions  Patient/family education;ADLs/Self Care Home Management;Cryotherapy;Electrical Stimulation;Iontophoresis /ml Dexamethasone;Moist Heat;Ultrasound;Dry needling;Manual techniques;Therapeutic activities;Therapeutic exercise;Neuromuscular re-education    PT Next Visit Plan  review HEP; progress with gentle stretching Rt shoulder; progress with active exercise Rt UE (SL ER, standing scaption, incline slide Rt UE); manual work; PROM; modalities as indicated - assess response to change in exercise plan.     PT Home Exercise Plan  [id number]    Consulted and Agree with Plan of Care  Patient       Patient will benefit from skilled therapeutic intervention in order to improve the following deficits and impairments:  Postural dysfunction, Improper body mechanics, Pain, Increased  fascial restricitons, Increased muscle spasms, Decreased strength, Decreased range of motion, Decreased activity tolerance, Impaired UE functional use  Visit Diagnosis: Acute pain of  right shoulder  Other symptoms and signs involving the musculoskeletal system  Abnormal posture  Muscle weakness (generalized)     Problem List Patient Active Problem List   Diagnosis Date Noted  . Traumatic incomplete tear of right rotator cuff 04/08/2019  . Lumbosacral strain 03/18/2017    Hala Narula Rober MinionP Callahan Peddie PT, MPH  05/01/2019, 11:58 AM  [facility] [address] [place], [place], [address] Phone: [phone]   Fax:  [phone]  Name: Matthew Escobar MRN: [id number] Date of Birth: 1956-09-20

## 2019-05-06 ENCOUNTER — Ambulatory Visit: Payer: 59 | Admitting: Rehabilitative and Restorative Service Providers"

## 2019-05-06 ENCOUNTER — Other Ambulatory Visit: Payer: Self-pay

## 2019-05-06 ENCOUNTER — Encounter: Payer: Self-pay | Admitting: Rehabilitative and Restorative Service Providers"

## 2019-05-06 DIAGNOSIS — M6281 Muscle weakness (generalized): Secondary | ICD-10-CM

## 2019-05-06 DIAGNOSIS — M25511 Pain in right shoulder: Secondary | ICD-10-CM

## 2019-05-06 DIAGNOSIS — R293 Abnormal posture: Secondary | ICD-10-CM

## 2019-05-06 DIAGNOSIS — R29898 Other symptoms and signs involving the musculoskeletal system: Secondary | ICD-10-CM

## 2019-05-06 NOTE — Therapy (Signed)
[facility] [place] [facility] [place] [address] [place] [address] [place], [place], [address] Phone: [phone]   Fax:  [phone]  Physical Therapy Treatment  Patient Details  Name: Matthew Escobar MRN: [id number] Date of Birth: 05/22/1956 Referring Provider (PT): Dr Clementeen Graham    Encounter Date: 05/06/2019  PT End of Session - 05/06/19 1104    Visit Number  6    Number of Visits  12    Date for PT Re-Evaluation  05/27/19    PT Start Time  1102    PT Stop Time  1148   vaso unavailable    PT Time Calculation (min)  46 min    Activity Tolerance  Patient tolerated treatment well       Past Medical History:  Diagnosis Date  . Hypertension     Past Surgical History:  Procedure Laterality Date  . ABDOMINAL SURGERY      There were no vitals filed for this visit.  Subjective Assessment - 05/06/19 1115    Subjective  Patient reports that he has been working on his exercises at home and is having less pain. He is trying to use Rt UE less and can tell he has less pain.     Currently in Pain?  No/denies                       [facility] Adult PT Treatment/Exercise - 05/06/19 0001      Shoulder Exercises: Seated   Extension Limitations  seated scapular depression pressing into yoga blocks 10 reps x 2 set       Shoulder Exercises: Standing   Other Standing Exercises  scap squeeze 10 sec x 10; scap squeeze w/ bilat UE's in slight ER elbows flexed 90 deg x10 - all ex with noodle along spine     Other Standing Exercises  shoulder scaption 75-80 deg x 10 no pain       Shoulder Exercises: Pulleys   Flexion  --   10 sec hold x 5 reps      Shoulder Exercises: Therapy Ball   Other Therapy Ball Exercises  10 inch ball exercises - elbow flexion/extension; shoulder flexion/extension; horizontal abd/add; wood chopping diagonals       Cryotherapy   Number Minutes Cryotherapy  15 Minutes    Cryotherapy Location  Shoulder   Rt    Type of Cryotherapy  Ice  pack      Electrical Stimulation   Electrical Stimulation Location  Rt shoulder    Electrical Stimulation Action  IFC    Electrical Stimulation Parameters  to tolerance    Electrical Stimulation Goals  Pain;Tone                  PT Long Term Goals - 04/15/19 1331      PT LONG TERM GOAL #1   Title  Patient I in HEP at discharge 05/27/2019    Time  6    Period  Weeks    Status  New      PT LONG TERM GOAL #2   Title  4+/5 to 5/5 strength Rt shoulder 05/27/2019    Time  6    Period  Weeks    Status  New      PT LONG TERM GOAL #3   Title  Increase AROM Rt shoulder to equal or greater that AROM Lt shoulder 05/27/2019    Time  6    Period  Weeks  PT LONG TERM GOAL #4   Title  Patient reports return to normal functional use of Rt UE including lifting up to 10# without pain greater than 0/10 to 2/10 at greatest 05/27/2019    Time  6    Period  Weeks    Status  New      PT LONG TERM GOAL #5   Title  Improve FOTO to </= 33% limitation 05/27/2019    Time  6    Period  Weeks    Status  New            Plan - 05/06/19 1110    Clinical Impression Statement  Patient reports compliance with HEP. Added scapular and shoulder stabilization and strengthening without difficulty. Patient demonstrates improving posture and alignment wit hincreased activation of posterior shoulder girdle musculature. Progressing toward goals of rehab.     Personal Factors and Comorbidities  Comorbidity 1;Comorbidity 2;Fitness    Comorbidities  HTN; obesity    Examination-Activity Limitations  Lift;Reach Overhead;Carry    Examination-Participation Restrictions  Yard Work;Cleaning    Stability/Clinical Decision Making  Evolving/Moderate complexity    Rehab Potential  Good    PT Frequency  2x / week    PT Duration  6 weeks    PT Treatment/Interventions  Patient/family education;ADLs/Self Care Home Management;Cryotherapy;Electrical Stimulation;Iontophoresis 4mg /ml Dexamethasone;Moist  Heat;Ultrasound;Dry needling;Manual techniques;Therapeutic activities;Therapeutic exercise;Neuromuscular re-education    PT Next Visit Plan  review HEP; progress with gentle stretching Rt shoulder; progress with active exercise Rt UE; manual work; PROM; modalities as indicated - assess response to change in exercise plan.     PT Home Exercise Plan  [id number]    Consulted and Agree with Plan of Care  Patient       Patient will benefit from skilled therapeutic intervention in order to improve the following deficits and impairments:  Postural dysfunction, Improper body mechanics, Pain, Increased fascial restricitons, Increased muscle spasms, Decreased strength, Decreased range of motion, Decreased activity tolerance, Impaired UE functional use  Visit Diagnosis: Acute pain of right shoulder  Other symptoms and signs involving the musculoskeletal system  Abnormal posture  Muscle weakness (generalized)     Problem List Patient Active Problem List   Diagnosis Date Noted  . Traumatic incomplete tear of right rotator cuff 04/08/2019  . Lumbosacral strain 03/18/2017    Gracynn Rajewski Rober MinionP Fronie Holstein PT, MPH  05/06/2019, 11:32 AM  [facility] [address] [place] [address] [place], [place], [address] Phone: [phone]   Fax:  [phone]  Name: Matthew Escobar MRN: [id number] Date of Birth: 01-21-1956

## 2019-05-12 ENCOUNTER — Ambulatory Visit (INDEPENDENT_AMBULATORY_CARE_PROVIDER_SITE_OTHER): Payer: 59 | Admitting: Family Medicine

## 2019-05-12 ENCOUNTER — Encounter: Payer: Self-pay | Admitting: Family Medicine

## 2019-05-12 VITALS — BP 138/86 | HR 82 | Temp 98.3°F | Wt 229.0 lb

## 2019-05-12 DIAGNOSIS — M25511 Pain in right shoulder: Secondary | ICD-10-CM | POA: Diagnosis not present

## 2019-05-12 DIAGNOSIS — L739 Follicular disorder, unspecified: Secondary | ICD-10-CM

## 2019-05-12 DIAGNOSIS — S46011A Strain of muscle(s) and tendon(s) of the rotator cuff of right shoulder, initial encounter: Secondary | ICD-10-CM

## 2019-05-12 MED ORDER — DOXYCYCLINE HYCLATE 100 MG PO TABS: 100.0000 mg | ORAL_TABLET | Freq: Two times a day (BID) | ORAL | 0 refills | Status: AC

## 2019-05-12 NOTE — Patient Instructions (Addendum)
Thank you for coming in today.  Take doxycycline for infection for 1 week.  Use hibiclens body wash for 1 week.   Continue PT and home exercises for the right shoulder.   Check back in 6 weeks for shoulder if not better.   Keep me updated on skin infection if not better.     Folliculitis  Folliculitis is inflammation of the hair follicles. Folliculitis most commonly occurs on the scalp, thighs, legs, back, and buttocks. However, it can occur anywhere on the body. What are the causes? This condition may be caused by:  A bacterial infection (common).  A fungal infection.  A viral infection.  Coming into contact with certain chemicals, especially oils and tars.  Shaving or waxing.  Applying greasy ointments or creams to your skin often. Long-lasting folliculitis and folliculitis that keeps coming back can be caused by bacteria that live in the nostrils. What increases the risk? This condition is more likely to develop in people with:  A weakened immune system.  Diabetes.  Obesity. What are the signs or symptoms? Symptoms of this condition include:  Redness.  Soreness.  Swelling.  Itching.  Small white or yellow, pus-filled, itchy spots (pustules) that appear over a reddened area. If there is an infection that goes deep into the follicle, these may develop into a boil (furuncle).  A group of closely packed boils (carbuncle). These tend to form in hairy, sweaty areas of the body. How is this diagnosed? This condition is diagnosed with a skin exam. To find what is causing the condition, your health care provider may take a sample of one of the pustules or boils for testing. How is this treated? This condition may be treated by:  Applying warm compresses to the affected areas.  Taking an antibiotic medicine or applying an antibiotic medicine to the skin.  Applying or bathing with an antiseptic solution.  Taking an over-the-counter medicine to help with  itching.  Having a procedure to drain any pustules or boils. This may be done if a pustule or boil contains a lot of pus or fluid.  Laser hair removal. This may be done to treat long-lasting folliculitis. Follow these instructions at home:  If directed, apply heat to the affected area as often as told by your health care provider. Use the heat source that your health care provider recommends, such as a moist heat pack or a heating pad. ? Place a towel between your skin and the heat source. ? Leave the heat on for 20-30 minutes. ? Remove the heat if your skin turns bright red. This is especially important if you are unable to feel pain, heat, or cold. You may have a greater risk of getting burned.  If you were prescribed an antibiotic medicine, use it as told by your health care provider. Do not stop using the antibiotic even if you start to feel better.  Take over-the-counter and prescription medicines only as told by your health care provider.  Do not shave irritated skin.  Keep all follow-up visits as told by your health care provider. This is important. Get help right away if:  You have more redness, swelling, or pain in the affected area.  Red streaks are spreading from the affected area.  You have a fever. This information is not intended to replace advice given to you by your health care provider. Make sure you discuss any questions you have with your health care provider. Document Released: 02/11/2002 Document Revised: 06/22/2016 Document Reviewed:  09/23/2015 Elsevier Interactive Patient Education  [employer]2019 Elsevier Inc.

## 2019-05-12 NOTE — Progress Notes (Signed)
Matthew Escobar is a 63 y.o. male who [facility] today for shoulder pain.  Patient was last seen on April 22.  He had had ongoing shoulder pain and MRI showed full-thickness partial width tear of supraspinatus tendon with gap of about 10 mm.  He had a subacromial injection during that visit and was referred to physical therapy.  Wanted to avoid surgery due to COVID-19.  In the interim he has had 6 sessions of physical therapy most recently on May 20.  In physical therapy he had significant improvement in shoulder girdle stabilizing and motion. He notes that his shoulder pain has significantly improved.  He still has a little bit of pain but overall is a lot better.  Additionally he noticed starting a few days ago a small pimple in his right anterior groin.  It is become more red and more painful.  He was able to express a tiny amount of pus.  He is concerned that he is developed a skin infection.  No fevers chills nausea vomiting or diarrhea.   ROS:  As above  Exam:  BP 138/86   Pulse 82   Temp 98.3 F (36.8 C) (Oral)   Wt 229 lb (103.9 kg)   BMI 33.82 kg/m  Wt Readings from Last 5 Encounters:  05/12/19 229 lb (103.9 kg)  04/08/19 233 lb (105.7 kg)  03/30/19 234 lb (106.1 kg)  03/27/19 230 lb (104.3 kg)  01/16/18 226 lb (102.5 kg)   General: Well Developed, well nourished, and in no acute distress.  Neuro/Psych: Alert and oriented x3, extra-ocular muscles intact, able to move all 4 extremities, sensation grossly intact. Skin: Warm and dry, no rashes noted.  Small erythematous papule right anterior groin.  No fluctuance palpated.  No surrounding induration. Respiratory: Not using accessory muscles, speaking in full sentences, trachea midline.  Cardiovascular: Pulses palpable, no extremity edema. Abdomen: Does not appear distended. MSK:  Right shoulder: Normal-appearing Nontender. Normal range of motion. Strength 4+/5  abduction. 4/5 external rotation. 5/5 internal rotation. Positive Hawkins and Neer's test. Positive empty can test.   Contralateral left shoulder normal-appearing normal range of motion nontender normal strength negative impingement testing.  Pulses cap refill and sensation are intact distally.    Lab and Radiology Results EXAM: MRI OF THE RIGHT SHOULDER WITHOUT CONTRAST  TECHNIQUE: Multiplanar, multisequence MR imaging of the shoulder was performed. No intravenous contrast was administered.  COMPARISON:  None.  FINDINGS: Rotator cuff: Moderate tendinosis of the supraspinatus tendon with a 10 mm full-thickness tear of the anterior supraspinatus tendon. Infraspinatus tendon is intact. Teres minor tendon is intact. Mild tendinosis of the subscapularis tendon.  Muscles: Mild atrophy of the teres minor muscle. Remainder the rotator cuff muscles are normal.  Biceps long head:  Intact.  Acromioclavicular Joint: Mild arthropathy of the acromioclavicular joint. Type II acromion. Small amount of subacromial/subdeltoid bursal fluid.  Glenohumeral Joint: No joint effusion.  No chondral defect.  Labrum: Grossly intact, but evaluation is limited by lack of intraarticular fluid.  Bones:  No acute osseous abnormality.  No aggressive osseous lesion.  Other: No fluid collection or hematoma.  IMPRESSION: 1. Moderate tendinosis of the supraspinatus tendon with a 10 mm full-thickness tear of the anterior supraspinatus tendon. 2. Mild tendinosis of the subscapularis tendon. 3. Mild subacromial/subdeltoid bursitis.   Electronically Signed   By: Elige KoHetal  Patel   On: 04/05/2019 11:07  I personally (independently) visualized and performed the interpretation of the images attached in  this note.     Assessment and Plan: 63 y.o. male with  Right shoulder pain: Improving rotator cuff tear and rotator cuff tendinitis.  Given significant response to physical therapy will  extend course of physical therapy.  Recheck if not improving.  Skin lesion right groin is folliculitis.  Not yet mature abscess.  Plan to treat with doxycycline.  Cautioned against sunburn and calcium/iron with this medication.  Recheck if not improving.   PDMP not reviewed this encounter. Orders Placed This Encounter  Procedures  . Ambulatory referral to Physical Therapy    Referral Priority:   Routine    Referral Type:   Physical Medicine    Referral Reason:   Specialty Services Required    Requested Specialty:   Physical Therapy   Meds ordered this encounter  Medications  . doxycycline (VIBRA-TABS) 100 MG tablet    Sig: Take 1 tablet (100 mg total) by mouth 2 (two) times daily.    Dispense:  14 tablet    Refill:  0    Historical information moved to improve visibility of documentation.  Past Medical History:  Diagnosis Date  . Hypertension    Past Surgical History:  Procedure Laterality Date  . ABDOMINAL SURGERY     Social History   Tobacco Use  . Smoking status: Current Every Day Smoker  . Smokeless tobacco: Never Used  Substance Use Topics  . Alcohol use: Yes    Alcohol/week: 0.0 standard drinks   family history includes Breast cancer in his maternal grandmother and sister.  Medications: Current Outpatient Medications  Medication Sig Dispense Refill  . atorvastatin (LIPITOR) 10 MG tablet Take 10 mg by mouth.    . doxycycline (VIBRA-TABS) 100 MG tablet Take 1 tablet (100 mg total) by mouth 2 (two) times daily. 14 tablet 0  . lisinopril (PRINIVIL,ZESTRIL) 10 MG tablet TAKE ONE (1) TABLET BY MOUTH EVERY DAY  11  . LORazepam (ATIVAN) 1 MG tablet TAKE ONE TABLET BY MOUTH TWICE EVERY DAY -- AS NEEDED  0  . oxyCODONE-acetaminophen (PERCOCET/ROXICET) 5-325 MG tablet Take 1 tablet by mouth every 8 (eight) hours as needed for severe pain. 15 tablet 0   No current facility-administered medications for this visit.    Allergies  Allergen Reactions  . Sulfa Antibiotics        Discussed warning signs or symptoms. Please see discharge instructions. Patient expresses understanding.

## 2019-05-15 ENCOUNTER — Encounter: Payer: Self-pay | Admitting: Rehabilitative and Restorative Service Providers"

## 2019-05-18 ENCOUNTER — Ambulatory Visit: Payer: 59 | Admitting: Rehabilitative and Restorative Service Providers"

## 2019-05-18 ENCOUNTER — Other Ambulatory Visit: Payer: Self-pay

## 2019-05-18 ENCOUNTER — Encounter: Payer: Self-pay | Admitting: Rehabilitative and Restorative Service Providers"

## 2019-05-18 DIAGNOSIS — R29898 Other symptoms and signs involving the musculoskeletal system: Secondary | ICD-10-CM | POA: Diagnosis not present

## 2019-05-18 DIAGNOSIS — M6281 Muscle weakness (generalized): Secondary | ICD-10-CM | POA: Diagnosis not present

## 2019-05-18 DIAGNOSIS — M25511 Pain in right shoulder: Secondary | ICD-10-CM

## 2019-05-18 DIAGNOSIS — R293 Abnormal posture: Secondary | ICD-10-CM | POA: Diagnosis not present

## 2019-05-18 NOTE — Therapy (Signed)
[facility] [address] [place], [place], [address] Phone: [phone]   Fax:  [phone]  Physical Therapy Treatment  Patient Details  Name: Matthew Escobar MRN: [id number] Date of Birth: January 02, 1956 Referring Provider (PT): Dr Clementeen GrahamEvan Corey    Encounter Date: 05/18/2019  PT End of Session - 05/18/19 0804    Visit Number  7    Number of Visits  12    Date for PT Re-Evaluation  05/27/19    PT Start Time  0801    PT Stop Time  0856    PT Time Calculation (min)  55 min    Activity Tolerance  Patient tolerated treatment well       Past Medical History:  Diagnosis Date  . Hypertension     Past Surgical History:  Procedure Laterality Date  . ABDOMINAL SURGERY      There were no vitals filed for this visit.  Subjective Assessment - 05/18/19 0804    Subjective  Shoulder has been hurting since MD visit last week. He tested the strength in his Rt arm and he has had pain since. Wondering if he needs to have the surgery.     Currently in Pain?  Yes    Pain Score  4     Pain Location  Shoulder    Pain Orientation  Right    Pain Descriptors / Indicators  Aching    Pain Onset  More than a month ago    Pain Frequency  Intermittent         [facility] PT Assessment - 05/18/19 0001      Assessment   Medical Diagnosis  Rt RC tear     Referring Provider (PT)  Dr Clementeen GrahamEvan Corey     Onset Date/Surgical Date  03/26/19    Hand Dominance  Left    Next MD Visit  05/12/2019    Prior Therapy  here for LBP       AROM   Right Shoulder Extension  60 Degrees    Right Shoulder Flexion  155 Degrees    Right Shoulder ABduction  155 Degrees    Right Shoulder Internal Rotation  30 Degrees    Right Shoulder External Rotation  57 Degrees                   [facility] Adult PT Treatment/Exercise - 05/18/19 0001      Shoulder Exercises: Seated   Other Seated Exercises  activation of deltoid - lifting ~ 1 inch elbow and forearm x 10 x 2  sets UE supported on pillow     Other Seated Exercises  stabilization and activation of shoulder/scapular musculature using ball on wall and dowel working on small controlled movement.       Shoulder Exercises: Pulleys   Flexion  --   10 sec hold x 5 reps      Shoulder Exercises: Therapy Ball   Other Therapy Ball Exercises  scapular depression with ball 5 sec x 10; rhythmic stabilization PT moving ball       Shoulder Exercises: Isometric Strengthening   Other Isometric Exercises  reverse wall push up x 10       Cryotherapy   Number Minutes Cryotherapy  15 Minutes    Cryotherapy Location  Shoulder   Rt    Type of Cryotherapy  Ice pack      Electrical Stimulation   Electrical Stimulation Location  Rt shoulder    Electrical Stimulation Action  IFC    Electrical Stimulation Parameters  to tolerance    Electrical Stimulation Goals  Pain;Tone      Iontophoresis   Type of Iontophoresis  Dexamethasone    Location  Lt anterior lateral shoulder - Rt     Dose  120 mAmp dose/1 cc    Time  8-10 hours       Vasopneumatic   Number Minutes Vasopneumatic   15 minutes    Vasopnuematic Location   Shoulder    Vasopneumatic Pressure  Low    Vasopneumatic Temperature   34      Manual Therapy   Manual therapy comments  pt sitting     Soft tissue mobilization  deep tissue work through the pecs; deltiod; biceps area Rt shoulder       Kinesiotix   Inhibit Muscle   I strip regular kinesotape anterior shoulder biceps/deltoid; posterior shoulder Rt              PT Education - 05/18/19 0841    Education Details  ionto     Person(s) Educated  Patient    Methods  Explanation    Comprehension  Verbalized understanding          PT Long Term Goals - 04/15/19 1331      PT LONG TERM GOAL #1   Title  Patient I in HEP at discharge 05/27/2019    Time  6    Period  Weeks    Status  New      PT LONG TERM GOAL #2   Title  4+/5 to 5/5 strength Rt shoulder 05/27/2019    Time  6    Period   Weeks    Status  New      PT LONG TERM GOAL #3   Title  Increase AROM Rt shoulder to equal or greater that AROM Lt shoulder 05/27/2019    Time  6    Period  Weeks      PT LONG TERM GOAL #4   Title  Patient reports return to normal functional use of Rt UE including lifting up to 10# without pain greater than 0/10 to 2/10 at greatest 05/27/2019    Time  6    Period  Weeks    Status  New      PT LONG TERM GOAL #5   Title  Improve FOTO to </= 33% limitation 05/27/2019    Time  6    Period  Weeks    Status  New            Plan - 05/18/19 0804    Clinical Impression Statement  Increased pain in the Rt shoulder since resistive testing. Patient tolerated gentle exercise - neuromuscular re-education well. Worked on stabilization and manual work. Trial of ionto Rt shoulder. Gradual progress.     Personal Factors and Comorbidities  Comorbidity 1;Comorbidity 2;Fitness    Comorbidities  HTN; obesity    Examination-Activity Limitations  Lift;Reach Overhead;Carry    Examination-Participation Restrictions  Yard Work;Cleaning    Stability/Clinical Decision Making  Evolving/Moderate complexity    Rehab Potential  Good    PT Frequency  2x / week    PT Duration  6 weeks    PT Treatment/Interventions  Patient/family education;ADLs/Self Care Home Management;Cryotherapy;Electrical Stimulation;Iontophoresis 4mg /ml Dexamethasone;Moist Heat;Ultrasound;Dry needling;Manual techniques;Therapeutic activities;Therapeutic exercise;Neuromuscular re-education    PT Next Visit Plan  review HEP; progress with gentle stretching Rt shoulder; progress with active exercise Rt UE; manual work; PROM; modalities as indicated -  assess response to ionto    PT Home Exercise Plan  [id number]    Consulted and Agree with Plan of Care  Patient       Patient will benefit from skilled therapeutic intervention in order to improve the following deficits and impairments:  Postural dysfunction, Improper body mechanics, Pain,  Increased fascial restricitons, Increased muscle spasms, Decreased strength, Decreased range of motion, Decreased activity tolerance, Impaired UE functional use  Visit Diagnosis: Acute pain of right shoulder  Other symptoms and signs involving the musculoskeletal system  Abnormal posture  Muscle weakness (generalized)     Problem List Patient Active Problem List   Diagnosis Date Noted  . Traumatic incomplete tear of right rotator cuff 04/08/2019  . Lumbosacral strain 03/18/2017    Aayushi Solorzano [name] PT, MPH  05/18/2019, 8:56 AM  [facility] [address] [place], [place], [address] Phone: [phone]   Fax:  [phone]  Name: Matthew Escobar MRN: [id number] Date of Birth: [birth year]

## 2019-05-18 NOTE — Patient Instructions (Signed)

## 2019-05-19 ENCOUNTER — Encounter: Payer: Self-pay | Admitting: Rehabilitative and Restorative Service Providers"

## 2019-05-22 ENCOUNTER — Encounter: Payer: Self-pay | Admitting: Rehabilitative and Restorative Service Providers"

## 2019-05-22 ENCOUNTER — Ambulatory Visit: Payer: 59 | Admitting: Rehabilitative and Restorative Service Providers"

## 2019-05-22 ENCOUNTER — Other Ambulatory Visit: Payer: Self-pay

## 2019-05-22 DIAGNOSIS — R29898 Other symptoms and signs involving the musculoskeletal system: Secondary | ICD-10-CM | POA: Diagnosis not present

## 2019-05-22 DIAGNOSIS — R293 Abnormal posture: Secondary | ICD-10-CM

## 2019-05-22 DIAGNOSIS — M6281 Muscle weakness (generalized): Secondary | ICD-10-CM

## 2019-05-22 DIAGNOSIS — M25511 Pain in right shoulder: Secondary | ICD-10-CM

## 2019-05-22 NOTE — Therapy (Signed)
[facility] [name] [place], [place], [address] Phone: [phone]   Fax:  [phone]  Physical Therapy Treatment  Patient Details  Name: Matthew Escobar MRN: [id number] Date of Birth: [birth year] Referring Provider (PT): Dr Clementeen Graham    Encounter Date: 05/22/2019  PT End of Session - 05/22/19 1106    Visit Number  8    Number of Visits  12    Date for PT Re-Evaluation  05/27/19    PT Start Time  1104    PT Stop Time  1200    PT Time Calculation (min)  56 min    Activity Tolerance  Patient tolerated treatment well       Past Medical History:  Diagnosis Date  . Hypertension     Past Surgical History:  Procedure Laterality Date  . ABDOMINAL SURGERY      There were no vitals filed for this visit.  Subjective Assessment - 05/22/19 1106    Subjective  No pain today. He has been using arm more and has not had an increase in pain. Feels iono patch helped "a lot" - tape has helped but does not feel he needs the tape now.     Currently in Pain?  No/denies                       [facility] Adult PT Treatment/Exercise - 05/22/19 0001      Shoulder Exercises: Seated   Other Seated Exercises  stabilization and activation of shoulder/scapular musculature ball at side working on scapular depression and movement of pt while pt holds ball still; seated press down x 10; seated theraband with flex bar; working on bilat UE movement and unilateral movement of flexbar at different positions and heights      Shoulder Exercises: Sidelying   External Rotation  Strengthening;Right;5 reps   20 sec hold 5-10 sec eccentric release x 5 reps    ABduction  AROM;Strengthening;Right;5 reps   slow controlled lift and lower - focus on scap control    Other Sidelying Exercises  activation of the deltoid through partial range x 10 reps       Shoulder Exercises: Standing   Shoulder Elevation Limitations  wall push up with scap  control x 10     Other Standing Exercises  shoulder scaption 85-90 deg 20 sec hold with 5-10 sec eccentric lower x 5       Shoulder Exercises: Therapy Ball   Other Therapy Ball Exercises  scapular depression with ball 5 sec x 10; rhythmic stabilization PT moving ball       Shoulder Exercises: ROM/Strengthening   UBE (Upper Arm Bike)  L3 x 2 min 1 min fwd 1 back       Cryotherapy   Number Minutes Cryotherapy  15 Minutes    Cryotherapy Location  Shoulder   Rt    Type of Cryotherapy  Ice pack      Electrical Stimulation   Electrical Stimulation Location  Rt shoulder    Electrical Stimulation Action  IFC    Electrical Stimulation Parameters  to tolerance    Electrical Stimulation Goals  Pain;Tone      Iontophoresis   Type of Iontophoresis  Dexamethasone    Location  Rt anterior lateral shoulder     Dose  120 mAmp dose/1 cc    Time  8-10 hours       Vasopneumatic   Number Minutes Vasopneumatic   15 minutes  Vasopnuematic Location   Shoulder    Vasopneumatic Pressure  Low    Vasopneumatic Temperature   34      Manual Therapy   Manual therapy comments  pt sitting     Soft tissue mobilization  deep tissue work through the pecs; deltiod; biceps area Rt shoulder                   PT Long Term Goals - 04/15/19 1331      PT LONG TERM GOAL #1   Title  Patient I in HEP at discharge 05/27/2019    Time  6    Period  Weeks    Status  New      PT LONG TERM GOAL #2   Title  4+/5 to 5/5 strength Rt shoulder 05/27/2019    Time  6    Period  Weeks    Status  New      PT LONG TERM GOAL #3   Title  Increase AROM Rt shoulder to equal or greater that AROM Lt shoulder 05/27/2019    Time  6    Period  Weeks      PT LONG TERM GOAL #4   Title  Patient reports return to normal functional use of Rt UE including lifting up to 10# without pain greater than 0/10 to 2/10 at greatest 05/27/2019    Time  6    Period  Weeks    Status  New      PT LONG TERM GOAL #5   Title  Improve  FOTO to </= 33% limitation 05/27/2019    Time  6    Period  Weeks    Status  New            Plan - 05/22/19 1106    Clinical Impression Statement  Progressing well with scapular stabilization and UE strengthening. Added isometric holds with slow eccentric lowering in scaption standing and ER sidelying.     Personal Factors and Comorbidities  Comorbidity 1;Comorbidity 2;Fitness    Comorbidities  HTN; obesity    Examination-Activity Limitations  Lift;Reach Overhead;Carry    Examination-Participation Restrictions  Yard Work;Cleaning    Stability/Clinical Decision Making  Evolving/Moderate complexity    Rehab Potential  Good    PT Frequency  2x / week    PT Duration  6 weeks    PT Treatment/Interventions  Patient/family education;ADLs/Self Care Home Management;Cryotherapy;Electrical Stimulation;Iontophoresis 4mg /ml Dexamethasone;Moist Heat;Ultrasound;Dry needling;Manual techniques;Therapeutic activities;Therapeutic exercise;Neuromuscular re-education    PT Next Visit Plan  review HEP; progress with gentle stretching Rt shoulder; progress with active exercise Rt UE; manual work; PROM; modalities as indicated - assess response to ionto    PT Home Exercise Plan  [id number]    Consulted and Agree with Plan of Care  Patient       Patient will benefit from skilled therapeutic intervention in order to improve the following deficits and impairments:  Postural dysfunction, Improper body mechanics, Pain, Increased fascial restricitons, Increased muscle spasms, Decreased strength, Decreased range of motion, Decreased activity tolerance, Impaired UE functional use  Visit Diagnosis: Acute pain of right shoulder  Other symptoms and signs involving the musculoskeletal system  Abnormal posture  Muscle weakness (generalized)     Problem List Patient Active Problem List   Diagnosis Date Noted  . Traumatic incomplete tear of right rotator cuff 04/08/2019  . Lumbosacral strain 03/18/2017     Sitlali Koerner [name] PT, MPH  05/22/2019, 12:04 PM  [facility] [phone]  [address] [place], [place], [address] Phone: [phone]   Fax:  [phone]  Name: Jacqulyn DuckingJoseph Rymer MRN: [id number] Date of Birth: 1956/02/20

## 2019-05-25 ENCOUNTER — Other Ambulatory Visit: Payer: Self-pay

## 2019-05-25 ENCOUNTER — Encounter: Payer: Self-pay | Admitting: Rehabilitative and Restorative Service Providers"

## 2019-05-25 ENCOUNTER — Ambulatory Visit (INDEPENDENT_AMBULATORY_CARE_PROVIDER_SITE_OTHER): Payer: 59 | Admitting: Rehabilitative and Restorative Service Providers"

## 2019-05-25 DIAGNOSIS — R293 Abnormal posture: Secondary | ICD-10-CM | POA: Diagnosis not present

## 2019-05-25 DIAGNOSIS — M25511 Pain in right shoulder: Secondary | ICD-10-CM

## 2019-05-25 DIAGNOSIS — M6281 Muscle weakness (generalized): Secondary | ICD-10-CM | POA: Diagnosis not present

## 2019-05-25 DIAGNOSIS — R29898 Other symptoms and signs involving the musculoskeletal system: Secondary | ICD-10-CM | POA: Diagnosis not present

## 2019-05-25 NOTE — Therapy (Signed)
[place], [place], [address] Phone: [phone]   Fax:  [phone]  Physical Therapy Treatment  Patient Details  Name: Matthew Escobar MRN: [id number] Date of Birth: [birth year] Referring Provider (PT): Dr Lynne Leader    Encounter Date: 05/25/2019  PT End of Session - 05/25/19 0818    Visit Number  9    Number of Visits  12    Date for PT Re-Evaluation  05/27/19    PT Start Time  0800    PT Stop Time  0845    PT Time Calculation (min)  45 min    Activity Tolerance  Patient tolerated treatment well       Past Medical History:  Diagnosis Date  . Hypertension     Past Surgical History:  Procedure Laterality Date  . ABDOMINAL SURGERY      There were no vitals filed for this visit.  Subjective Assessment - 05/25/19 0819    Subjective  Doing well. No pain - some soreness from exercises but not painful     Currently in Pain?  No/denies                       [facility] Adult PT Treatment/Exercise - 05/25/19 0001      Shoulder Exercises: Standing   Horizontal ABduction  AROM;Strengthening;Both;10 reps   elbows bent at 90 deg shd girdle engaged activate deltoid    Other Standing Exercises  shoulder scaption 85-90 deg 30 sec hold with 5-10 sec eccentric lower x 5       Shoulder Exercises: ROM/Strengthening   UBE (Upper Arm Bike)  L3 x 4 min 2 min fwd 2 back       Shoulder Exercises: Isometric Strengthening   Other Isometric Exercises  reverse wall push up x 10; isometric ER hold steping to side 5-10 sec hold x 5; scap retraction stepping back 15-20 sec hold x 5 pulse with scap squeeze both with green tB       Shoulder Exercises: Body Blade   Flexion  30 seconds;5 reps   each UE   Other Body Blade Exercises  scaption 20-30 sec x 5 each UE     Other Body Blade Exercises  bilat UE chest level 30 sec x 5       Electrical Stimulation   Electrical Stimulation Location  Rt shoulder     Electrical Stimulation Action  IFC    Electrical Stimulation Parameters  to tolerance    Electrical Stimulation Goals  Pain;Tone      Vasopneumatic   Number Minutes Vasopneumatic   15 minutes    Vasopnuematic Location   Shoulder    Vasopneumatic Pressure  Low    Vasopneumatic Temperature   34      Manual Therapy   Manual therapy comments  pt sitting     Soft tissue mobilization  deep tissue work through posterior deltiod; infraspinitus Rt shoulder                   PT Long Term Goals - 04/15/19 1331      PT LONG TERM GOAL #1   Title  Patient I in HEP at discharge 05/27/2019    Time  6    Period  Weeks    Status  New      PT LONG TERM GOAL #2   Title  4+/5 to 5/5 strength Rt shoulder 05/27/2019    Time  6  Period  Weeks    Status  New      PT LONG TERM GOAL #3   Title  Increase AROM Rt shoulder to equal or greater that AROM Lt shoulder 05/27/2019    Time  6    Period  Weeks      PT LONG TERM GOAL #4   Title  Patient reports return to normal functional use of Rt UE including lifting up to 10# without pain greater than 0/10 to 2/10 at greatest 05/27/2019    Time  6    Period  Weeks    Status  New      PT LONG TERM GOAL #5   Title  Improve FOTO to </= 33% limitation 05/27/2019    Time  6    Period  Weeks    Status  New            Plan - 05/25/19 [id number]    Clinical Impression Statement  Continued to progress with strengthening Rt shoulder girdle and shoulder. Tolerated new exercises without difficulty. Some muscular soreness from exercises but no increase in pain.     Personal Factors and Comorbidities  Comorbidity 1;Comorbidity 2;Fitness    Comorbidities  HTN; obesity    Examination-Activity Limitations  Lift;Reach Overhead;Carry    Examination-Participation Restrictions  Yard Work;Cleaning    Stability/Clinical Decision Making  Evolving/Moderate complexity    Rehab Potential  Good    PT Frequency  2x / week    PT Duration  6 weeks    PT  Treatment/Interventions  Patient/family education;ADLs/Self Care Home Management;Cryotherapy;Electrical Stimulation;Iontophoresis 4mg /ml Dexamethasone;Moist Heat;Ultrasound;Dry needling;Manual techniques;Therapeutic activities;Therapeutic exercise;Neuromuscular re-education    PT Next Visit Plan  review HEP; gentle stretching Rt shoulder; progress active and resistive exercise Rt UE; manual work; PROM; modalities as indicated ionto as needed     PT Home Exercise Plan  [id number]    Consulted and Agree with Plan of Care  Patient       Patient will benefit from skilled therapeutic intervention in order to improve the following deficits and impairments:  Postural dysfunction, Improper body mechanics, Pain, Increased fascial restricitons, Increased muscle spasms, Decreased strength, Decreased range of motion, Decreased activity tolerance, Impaired UE functional use  Visit Diagnosis: Acute pain of right shoulder  Other symptoms and signs involving the musculoskeletal system  Abnormal posture  Muscle weakness (generalized)     Problem List Patient Active Problem List   Diagnosis Date Noted  . Traumatic incomplete tear of right rotator cuff 04/08/2019  . Lumbosacral strain 03/18/2017    Amillion Scobee Rober MinionP Raechal Raben PT, MPH  05/25/2019, 8:54 AM  [facility] [address] [place], [place], [address] Phone: [phone]   Fax:  [phone]  Name: Matthew Escobar MRN: [id number] Date of Birth: Dec 26, 1955

## 2019-05-28 ENCOUNTER — Encounter: Payer: Self-pay | Admitting: Rehabilitative and Restorative Service Providers"

## 2019-05-28 ENCOUNTER — Ambulatory Visit: Payer: 59 | Admitting: Rehabilitative and Restorative Service Providers"

## 2019-05-28 ENCOUNTER — Other Ambulatory Visit: Payer: Self-pay

## 2019-05-28 DIAGNOSIS — R29898 Other symptoms and signs involving the musculoskeletal system: Secondary | ICD-10-CM

## 2019-05-28 DIAGNOSIS — M6281 Muscle weakness (generalized): Secondary | ICD-10-CM

## 2019-05-28 DIAGNOSIS — R293 Abnormal posture: Secondary | ICD-10-CM | POA: Diagnosis not present

## 2019-05-28 DIAGNOSIS — M25511 Pain in right shoulder: Secondary | ICD-10-CM

## 2019-05-28 NOTE — Therapy (Signed)
[facility] [place] [address] [place], [place], [address] Phone: [phone]   Fax:  [phone]  Physical Therapy Treatment  Patient Details  Name: Matthew DuckingJoseph Escobar MRN: [id number] Date of Birth: [birth year] Referring Provider (PT): Dr Clementeen GrahamEvan Corey    Encounter Date: 05/28/2019  PT End of Session - 05/28/19 1411    Visit Number  10    Number of Visits  12    Date for PT Re-Evaluation  05/27/19    PT Start Time  1410    PT Stop Time  1500    PT Time Calculation (min)  50 min    Activity Tolerance  Patient tolerated treatment well       Past Medical History:  Diagnosis Date  . Hypertension     Past Surgical History:  Procedure Laterality Date  . ABDOMINAL SURGERY      There were no vitals filed for this visit.  Subjective Assessment - 05/28/19 1423    Subjective  Shoulder hurts today. Has really used it at work the past few days. No pain following last treatment - just soreness like the muscle has worked in a good way.    Currently in Pain?  Yes    Pain Score  3     Pain Location  Shoulder    Pain Orientation  Right    Pain Descriptors / Indicators  Aching    Pain Type  Chronic pain    Pain Radiating Towards  some into the front part of the shoulder    Pain Onset  More than a month ago    Pain Frequency  Intermittent    Aggravating Factors   use of Rt arm; lifting; reaching    Pain Relieving Factors  injection; meds; rest; sleep; ice; TENS unit                       [facility] Adult PT Treatment/Exercise - 05/28/19 0001      Shoulder Exercises: Seated   Retraction Limitations  scap retraction shoulder bllades down and back - 10-15 sec hold x 5     External Rotation  AROM;Strengthening   seated elbow supported on table in ~ 45 deg abd elbow 90    External Rotation Limitations  working through partial range - initiating motion x 10 x 2 sets; working on mid to end range 10 reps x 1 [employer] traderset       Electrical  Stimulation   Electrical Stimulation Location  Rt shoulder    Electrical Stimulation Action  IFC    Electrical Stimulation Parameters  to tolerance    Electrical Stimulation Goals  Pain;Tone      Iontophoresis   Type of Iontophoresis  Dexamethasone    Location  Rt anterior    Dose  120 mAmp dose/1 cc    Time  8-10 hours       Vasopneumatic   Number Minutes Vasopneumatic   15 minutes    Vasopnuematic Location   Shoulder    Vasopneumatic Pressure  Low    Vasopneumatic Temperature   34      Manual Therapy   Manual therapy comments  pt sitting     Joint Mobilization  GH joint glides     Soft tissue mobilization  deep tissue work through posterior deltiod; infraspinitus; anterior shoulder through supraspinitus insertion and biceps Rt shoulder  PT Long Term Goals - 04/15/19 1331      PT LONG TERM GOAL #1   Title  Patient I in HEP at discharge 05/27/2019    Time  6    Period  Weeks    Status  New      PT LONG TERM GOAL #2   Title  4+/5 to 5/5 strength Rt shoulder 05/27/2019    Time  6    Period  Weeks    Status  New      PT LONG TERM GOAL #3   Title  Increase AROM Rt shoulder to equal or greater that AROM Lt shoulder 05/27/2019    Time  6    Period  Weeks      PT LONG TERM GOAL #4   Title  Patient reports return to normal functional use of Rt UE including lifting up to 10# without pain greater than 0/10 to 2/10 at greatest 05/27/2019    Time  6    Period  Weeks    Status  New      PT LONG TERM GOAL #5   Title  Improve FOTO to </= 33% limitation 05/27/2019    Time  6    Period  Weeks    Status  New            Plan - 05/28/19 1423    Clinical Impression Statement  Flare up of Rt shoulder pain related to pt's activities at work. Cautioned pt about overuse of Rt UE. Good response to treatment with resolution of pain with active exercise; manual work and modalities. Patient needs to continue to work on strengthening and stabilization and  avoid overuse of Rt UE.    Personal Factors and Comorbidities  Comorbidity 1;Comorbidity 2;Fitness    Comorbidities  HTN; obesity    Examination-Activity Limitations  Lift;Reach Overhead;Carry    Examination-Participation Restrictions  Yard Work;Cleaning    Stability/Clinical Decision Making  Evolving/Moderate complexity    Rehab Potential  Good    PT Frequency  2x / week    PT Duration  6 weeks    PT Treatment/Interventions  Patient/family education;ADLs/Self Care Home Management;Cryotherapy;Electrical Stimulation;Iontophoresis 4mg /ml Dexamethasone;Moist Heat;Ultrasound;Dry needling;Manual techniques;Therapeutic activities;Therapeutic exercise;Neuromuscular re-education    PT Next Visit Plan  review HEP; gentle stretching Rt shoulder; progress active and resistive exercise Rt UE; manual work; PROM; modalities as indicated ionto as needed; add ER shoulder 90/90' ? TB ER UE supported on table; ? plank on counter; ? quadraped    PT Home Exercise Plan  [id number]    Consulted and Agree with Plan of Care  Patient       Patient will benefit from skilled therapeutic intervention in order to improve the following deficits and impairments:  Postural dysfunction, Improper body mechanics, Pain, Increased fascial restricitons, Increased muscle spasms, Decreased strength, Decreased range of motion, Decreased activity tolerance, Impaired UE functional use  Visit Diagnosis: Acute pain of right shoulder  Other symptoms and signs involving the musculoskeletal system  Abnormal posture  Muscle weakness (generalized)     Problem List Patient Active Problem List   Diagnosis Date Noted  . Traumatic incomplete tear of right rotator cuff 04/08/2019  . Lumbosacral strain 03/18/2017    Matthew Escobar PT, MPH  05/28/2019, 2:47 PM  [facility] [place], [place], [address] Phone: [phone]   Fax:  [phone]  Name: Matthew Escobar MRN: [id number] Date of Birth: 1956/05/20

## 2019-06-03 ENCOUNTER — Ambulatory Visit: Payer: 59 | Admitting: Rehabilitative and Restorative Service Providers"

## 2019-06-03 ENCOUNTER — Other Ambulatory Visit: Payer: Self-pay

## 2019-06-03 ENCOUNTER — Encounter: Payer: Self-pay | Admitting: Rehabilitative and Restorative Service Providers"

## 2019-06-03 DIAGNOSIS — R293 Abnormal posture: Secondary | ICD-10-CM

## 2019-06-03 DIAGNOSIS — R29898 Other symptoms and signs involving the musculoskeletal system: Secondary | ICD-10-CM

## 2019-06-03 DIAGNOSIS — M6281 Muscle weakness (generalized): Secondary | ICD-10-CM | POA: Diagnosis not present

## 2019-06-03 DIAGNOSIS — M25511 Pain in right shoulder: Secondary | ICD-10-CM

## 2019-06-03 NOTE — Therapy (Signed)
[facility] [address] [place], [place], [address] Phone: [phone]   Fax:  [phone]  Physical Therapy Treatment  Patient Details  Name: Matthew Escobar MRN: [id number] Date of Birth: 28-Jun-1956 Referring Provider (PT): Dr Clementeen GrahamEvan Corey    Encounter Date: 06/03/2019  PT End of Session - 06/03/19 0815    Visit Number  11    Number of Visits  24    Date for PT Re-Evaluation  07/15/19    PT Start Time  0805    PT Stop Time  0900    PT Time Calculation (min)  55 min    Activity Tolerance  Patient tolerated treatment well       Past Medical History:  Diagnosis Date  . Hypertension     Past Surgical History:  Procedure Laterality Date  . ABDOMINAL SURGERY      There were no vitals filed for this visit.  Subjective Assessment - 06/03/19 0808    Subjective  Shoulder is "OK" still has some annoying aching. Doing more at home - using Rt UE.    Currently in Pain?  Yes    Pain Score  1     Pain Location  Shoulder    Pain Orientation  Right    Pain Descriptors / Indicators  Aching    Pain Type  Acute pain         [facility] PT Assessment - 06/03/19 0001      Assessment   Medical Diagnosis  Rt RC tear     Referring Provider (PT)  Dr Clementeen GrahamEvan Corey     Onset Date/Surgical Date  03/26/19    Hand Dominance  Left    Next MD Visit  05/12/2019    Prior Therapy  here for LBP       Observation/Other Assessments   Focus on Therapeutic Outcomes (FOTO)   38% limitation       AROM   Right Shoulder Extension  70 Degrees    Right Shoulder Flexion  162 Degrees    Right Shoulder ABduction  167 Degrees    Right Shoulder Internal Rotation  35 Degrees    Right Shoulder External Rotation  78 Degrees    Left Shoulder Extension  66 Degrees    Left Shoulder Flexion  157 Degrees    Left Shoulder ABduction  158 Degrees    Left Shoulder Internal Rotation  39 Degrees    Left Shoulder External Rotation  90 Degrees      Strength   Right Shoulder Flexion  5/5   5-/5   Right Shoulder ABduction  --   5-/5   Right Shoulder Internal Rotation  --   5-/5   Right Shoulder External Rotation  4/5   discomfort      Palpation   Palpation comment  muscular tightness through the Rt shoulder girdle - pecs; upper trap; leveator; anterior GH joint                    [facility] Adult PT Treatment/Exercise - 06/03/19 0001      Shoulder Exercises: Seated   External Rotation  AROM;Strengthening   seated elbow supported on table in ~ 45 deg abd elbow 90    Theraband Level (Shoulder External Rotation)  Level 1 (Yellow)   x 10 reps without resistance repeated x 10 with yellow TB    External Rotation Limitations  at wall ball at dorsum of hand working in small circles 90/90 back at  wall - 2 sets of 10       Shoulder Exercises: Standing   Other Standing Exercises  biceps curls 5# x 10 x 2 sets; hammer curls 5# x 10 reps x 2 sets       Shoulder Exercises: ROM/Strengthening   UBE (Upper Arm Bike)  L3 x 4 min 2 min fwd 2 back       Shoulder Exercises: Stretch   Corner Stretch Limitations  doorway stretch 3 positions x 30 sec x 2 reps     Other Shoulder Stretches  biceps stretch 20-30 sec x 2 reps       Electrical Stimulation   Electrical Stimulation Location  Rt shoulder    Electrical Stimulation Action  IFC    Electrical Stimulation Parameters  to tolerance    Electrical Stimulation Goals  Pain;Tone      Vasopneumatic   Number Minutes Vasopneumatic   15 minutes    Vasopnuematic Location   Shoulder    Vasopneumatic Pressure  Low    Vasopneumatic Temperature   34      Manual Therapy   Manual therapy comments  pt sitting     Joint Mobilization  GH joint glides     Soft tissue mobilization  deep tissue work through posterior deltiod; infraspinitus; anterior shoulder through supraspinitus insertion and biceps Rt shoulder              PT Education - 06/03/19 0855    Education Details  HEP    Person(s) Educated   Patient    Methods  Explanation;Demonstration;Tactile cues;Verbal cues;Handout    Comprehension  Verbalized understanding;Returned demonstration;Verbal cues required;Tactile cues required          PT Long Term Goals - 06/03/19 0913      PT LONG TERM GOAL #1   Title  Patient I in HEP at discharge 07/15/2019    Time  12    Period  Weeks    Status  Revised      PT LONG TERM GOAL #2   Title  4+/5 to 5/5 strength Rt shoulder 07/15/2019    Time  12    Period  Weeks    Status  Revised      PT LONG TERM GOAL #3   Title  Increase AROM Rt shoulder to equal or greater that AROM Lt shoulder 7.29/2020    Time  12    Period  Weeks    Status  Revised      PT LONG TERM GOAL #4   Title  Patient reports return to normal functional use of Rt UE including lifting up to 10# without pain greater than 0/10 to 2/10 at greatest 05/27/2019    Time  6    Period  Weeks    Status  Achieved      PT LONG TERM GOAL #5   Title  Improve FOTO to </= 33% limitation 07/15/2019    Time  12    Period  Weeks    Status  Revised            Plan - 06/03/19 0816    Clinical Impression Statement  Decreased pain today. Patient demonstrates improved strength and ROM Rt UE. He is adding functional activites at home and work. Added exercises without difficulty. Progressing well toward stated goals of therapy and will benefit from continued treatment to reach rehab goals.    Personal Factors and Comorbidities  Comorbidity 1;Comorbidity 2;Fitness    Comorbidities  HTN; obesity  Examination-Activity Limitations  Lift;Reach Overhead;Carry    Examination-Participation Restrictions  Yard Work;Cleaning    Stability/Clinical Decision Making  Evolving/Moderate complexity    Rehab Potential  Good    PT Frequency  2x / week    PT Duration  6 weeks    PT Treatment/Interventions  Patient/family education;ADLs/Self Care Home Management;Cryotherapy;Electrical Stimulation;Iontophoresis 4mg /ml Dexamethasone;Moist  Heat;Ultrasound;Dry needling;Manual techniques;Therapeutic activities;Therapeutic exercise;Neuromuscular re-education    PT Next Visit Plan  review HEP; stretching Rt shoulder; progress active and resistive exercise Rt UE; manual work; PROM; modalities as indicated ionto as needed; add ? plank on counter; ? quadraped    PT Home Exercise Plan  [id number]    Consulted and Agree with Plan of Care  Patient       Patient will benefit from skilled therapeutic intervention in order to improve the following deficits and impairments:  Postural dysfunction, Improper body mechanics, Pain, Increased fascial restricitons, Increased muscle spasms, Decreased strength, Decreased range of motion, Decreased activity tolerance, Impaired UE functional use  Visit Diagnosis: 1. Acute pain of right shoulder   2. Other symptoms and signs involving the musculoskeletal system   3. Abnormal posture   4. Muscle weakness (generalized)        Problem List Patient Active Problem List   Diagnosis Date Noted  . Traumatic incomplete tear of right rotator cuff 04/08/2019  . Lumbosacral strain 03/18/2017    Iridian Reader Rober MinionP Michaele Amundson PT, MPH  06/03/2019, 9:22 AM  [facility] [address] [place], [place], [address] Phone: [phone]   Fax:  [phone]  Name: Matthew Escobar MRN: [id number] Date of Birth: [birth year]

## 2019-06-03 NOTE — Patient Instructions (Addendum)
Scapula Adduction With Pectorals, Low   Stand in doorframe with palms against frame and arms at 45. Lean forward and squeeze shoulder blades. Hold _30__ seconds. Repeat _3__ times per session. Do _2__ sessions per day.   Scapula Adduction With Pectorals, Mid-Range   Stand in doorframe with palms against frame and arms at 90. Lean forward and squeeze shoulder blades. Hold _30__ seconds. Repeat _3__ times per session. Do _2__ sessions per day.  \Scapula Adduction With Pectorals, High   Stand in doorframe with palms against frame and arms at 120. Lean forward and squeeze shoulder blades. Hold _30__ seconds. Repeat _3__ times per session. Do __2_ sessions per day.    Biceps curl  Hammer curl   Ball on wall at back of hand - working toward 90/90 with back at wall   Sitting arm supported - shoulder blades down and back lifting hand back  No resistance the yellow band  Strengthening: External Rotation - in 90 of Abduction NO PAIN     Facing anchor, tubing around right hand, elbow bent 90, forearm forward, pull forearm back, keeping elbow bent. Repeat ___10_ times per set. Do _1-2___ sets per session. Do __1__ sessions per day.

## 2019-06-09 ENCOUNTER — Ambulatory Visit: Payer: 59 | Admitting: Rehabilitative and Restorative Service Providers"

## 2019-06-09 ENCOUNTER — Encounter: Payer: Self-pay | Admitting: Rehabilitative and Restorative Service Providers"

## 2019-06-09 ENCOUNTER — Other Ambulatory Visit: Payer: Self-pay

## 2019-06-09 DIAGNOSIS — M6281 Muscle weakness (generalized): Secondary | ICD-10-CM | POA: Diagnosis not present

## 2019-06-09 DIAGNOSIS — R29898 Other symptoms and signs involving the musculoskeletal system: Secondary | ICD-10-CM | POA: Diagnosis not present

## 2019-06-09 DIAGNOSIS — R293 Abnormal posture: Secondary | ICD-10-CM

## 2019-06-09 DIAGNOSIS — M25511 Pain in right shoulder: Secondary | ICD-10-CM | POA: Diagnosis not present

## 2019-06-09 NOTE — Therapy (Signed)
[place], [place], [address] Phone: [phone]   Fax:  [phone]  Physical Therapy Treatment  Patient Details  Name: Matthew Escobar MRN: [id number] Date of Birth: 1956/06/04 Referring Provider (PT): Dr Lynne Leader    Encounter Date: 06/09/2019  PT End of Session - 06/09/19 0808    Visit Number  12    Number of Visits  24    Date for PT Re-Evaluation  07/15/19    PT Start Time  0805    PT Stop Time  0859    PT Time Calculation (min)  54 min    Activity Tolerance  Patient tolerated treatment well       Past Medical History:  Diagnosis Date  . Hypertension     Past Surgical History:  Procedure Laterality Date  . ABDOMINAL SURGERY      There were no vitals filed for this visit.  Subjective Assessment - 06/09/19 0809    Subjective  Shoulder is "sore" = just annoyed. Thinks it is from the work out last week.    Currently in Pain?  Yes    Pain Score  2     Pain Location  Shoulder    Pain Orientation  Right    Pain Descriptors / Indicators  Sore;Aching    Pain Type  Acute pain    Pain Onset  More than a month ago    Pain Frequency  Intermittent   constant in the past few days                      [facility] Adult PT Treatment/Exercise - 06/09/19 0001      Shoulder Exercises: Seated   Retraction Limitations  scap retraction shoulder bllades down and back - 10-15 sec hold x 5       Shoulder Exercises: Standing   ABduction Limitations  active scaption w/ noodle along spine x 10 reps 0-90 deg thumb up     Shoulder Elevation Limitations  wall slide w/ eccentric lowering of Rt UE ~150 deg to 90 deg x 5 reps w/ slow eccentric lowering       Shoulder Exercises: Pulleys   Flexion  --   10 sec x 8 reps    Flexion Limitations  eccentric lowering end range flexion to ~ 90 deg - slow eccentric control       Shoulder Exercises: ROM/Strengthening   UBE (Upper Arm Bike)  L3 x 4 min  2 min fwd 2 back       Electrical Stimulation   Electrical Stimulation Location  Rt shoulder    Electrical Stimulation Action  IFC    Electrical Stimulation Parameters  to tolerance    Electrical Stimulation Goals  Pain;Tone      Vasopneumatic   Number Minutes Vasopneumatic   15 minutes    Vasopnuematic Location   Shoulder    Vasopneumatic Pressure  Low    Vasopneumatic Temperature   34      Manual Therapy   Manual therapy comments  pt sitting     Soft tissue mobilization  deep tissue work through posterior deltiod; infraspinitus; anterior shoulder through supraspinitus insertion and biceps Rt shoulder                   PT Long Term Goals - 06/03/19 0913      PT LONG TERM GOAL #1   Title  Patient I in HEP at discharge 07/15/2019  Time  12    Period  Weeks    Status  Revised      PT LONG TERM GOAL #2   Title  4+/5 to 5/5 strength Rt shoulder 07/15/2019    Time  12    Period  Weeks    Status  Revised      PT LONG TERM GOAL #3   Title  Increase AROM Rt shoulder to equal or greater that AROM Lt shoulder 7.29/2020    Time  12    Period  Weeks    Status  Revised      PT LONG TERM GOAL #4   Title  Patient reports return to normal functional use of Rt UE including lifting up to 10# without pain greater than 0/10 to 2/10 at greatest 05/27/2019    Time  6    Period  Weeks    Status  Achieved      PT LONG TERM GOAL #5   Title  Improve FOTO to </= 33% limitation 07/15/2019    Time  12    Period  Weeks    Status  Revised            Plan - 06/09/19 0809    Clinical Impression Statement  some increased discomfort after adding isolated work for ER last visit. Added eccentric lowering exercise with some discomfort reported and early fatigue. Encouraged patient to avoid work and home tasks that irritate shoulder so we can focus on strengthening in the clinic. He is gradually progressing with rehab.    Personal Factors and Comorbidities  Comorbidity 1;Comorbidity  2;Fitness    Comorbidities  HTN; obesity    Examination-Activity Limitations  Lift;Reach Overhead;Carry    Examination-Participation Restrictions  Yard Work;Cleaning    Stability/Clinical Decision Making  Evolving/Moderate complexity    Rehab Potential  Good    PT Frequency  2x / week    PT Duration  6 weeks    PT Treatment/Interventions  Patient/family education;ADLs/Self Care Home Management;Cryotherapy;Electrical Stimulation;Iontophoresis 4mg /ml Dexamethasone;Moist Heat;Ultrasound;Dry needling;Manual techniques;Therapeutic activities;Therapeutic exercise;Neuromuscular re-education    PT Next Visit Plan  review HEP; stretching Rt shoulder; progress active and resistive exercise Rt UE; manual work; PROM; modalities as indicated ionto as needed; add eccentric lowering activities; ? plank on counter; ? quadraped    PT Home Exercise Plan  [id number]    Consulted and Agree with Plan of Care  Patient       Patient will benefit from skilled therapeutic intervention in order to improve the following deficits and impairments:  Postural dysfunction, Improper body mechanics, Pain, Increased fascial restricitons, Increased muscle spasms, Decreased strength, Decreased range of motion, Decreased activity tolerance, Impaired UE functional use  Visit Diagnosis: 1. Acute pain of right shoulder   2. Other symptoms and signs involving the musculoskeletal system   3. Abnormal posture   4. Muscle weakness (generalized)        Problem List Patient Active Problem List   Diagnosis Date Noted  . Traumatic incomplete tear of right rotator cuff 04/08/2019  . Lumbosacral strain 03/18/2017    Olamae Ferrara Rober MinionP Makailyn Mccormick PT, MPH  06/09/2019, 8:46 AM  [facility] [address] [place], [place], [address] Phone: [phone]   Fax:  [phone]  Name: Jacqulyn DuckingJoseph Mcadam MRN: [id number] Date of Birth: 1956/08/02

## 2019-06-12 ENCOUNTER — Ambulatory Visit: Payer: 59 | Admitting: Rehabilitative and Restorative Service Providers"

## 2019-06-12 ENCOUNTER — Other Ambulatory Visit: Payer: Self-pay

## 2019-06-12 ENCOUNTER — Encounter: Payer: Self-pay | Admitting: Rehabilitative and Restorative Service Providers"

## 2019-06-12 DIAGNOSIS — M25511 Pain in right shoulder: Secondary | ICD-10-CM | POA: Diagnosis not present

## 2019-06-12 DIAGNOSIS — R29898 Other symptoms and signs involving the musculoskeletal system: Secondary | ICD-10-CM

## 2019-06-12 DIAGNOSIS — R293 Abnormal posture: Secondary | ICD-10-CM

## 2019-06-12 DIAGNOSIS — M6281 Muscle weakness (generalized): Secondary | ICD-10-CM | POA: Diagnosis not present

## 2019-06-12 NOTE — Therapy (Addendum)
[facility] [address] [place], [place], [address] Phone: [phone]   Fax:  [phone]  Physical Therapy Treatment  Patient Details  Name: Matthew Escobar MRN: [id number] Date of Birth: May 09, 1956 Referring Provider (PT): Dr Clementeen GrahamEvan Corey    Encounter Date: 06/12/2019  PT End of Session - 06/12/19 0831    Visit Number  13    Number of Visits  24    Date for PT Re-Evaluation  07/15/19    PT Start Time  0805    PT Stop Time  0843    PT Time Calculation (min)  38 min    Activity Tolerance  Patient tolerated treatment well       Past Medical History:  Diagnosis Date  . Hypertension     Past Surgical History:  Procedure Laterality Date  . ABDOMINAL SURGERY      There were no vitals filed for this visit.  Subjective Assessment - 06/12/19 0833    Subjective  Shoulder is hurting today. it woke him up during the night and has continued to hurt this am.    Currently in Pain?  Yes    Pain Score  7     Pain Location  Shoulder    Pain Orientation  Right    Pain Descriptors / Indicators  Aching;Burning                       [facility] Adult PT Treatment/Exercise - 06/12/19 0001      Shoulder Exercises: Seated   Retraction Limitations  scap retraction shoulder bllades down and back - 10-15 sec hold x 5     Other Seated Exercises  activation of deltoid - lifting ~ 1 inch elbow and forearm x 10 x 2 sets UE supported on pillow       Shoulder Exercises: Standing   ABduction Limitations  active scaption w/ noodle along spine x 10 reps 0-90 deg thumb up       Electrical Stimulation   Electrical Stimulation Location  Rt shoulder    Electrical Stimulation Action  IFC    Electrical Stimulation Parameters  to tolerance    Electrical Stimulation Goals  Pain;Tone      Iontophoresis   Type of Iontophoresis  Dexamethasone    Location  Rt anterior    Dose  120 mAmp dose/1 cc    Time  8-10 hours       Vasopneumatic   Number Minutes Vasopneumatic   15 minutes    Vasopnuematic Location   Shoulder    Vasopneumatic Pressure  Low    Vasopneumatic Temperature   34      Manual Therapy   Manual therapy comments  pt sitting     Soft tissue mobilization  deep tissue work through posterior deltiod; infraspinitus; anterior shoulder through supraspinitus insertion and biceps Rt shoulder                   PT Long Term Goals - 06/03/19 0913      PT LONG TERM GOAL #1   Title  Patient I in HEP at discharge 07/15/2019    Time  12    Period  Weeks    Status  Revised      PT LONG TERM GOAL #2   Title  4+/5 to 5/5 strength Rt shoulder 07/15/2019    Time  12    Period  Weeks    Status  Revised  PT LONG TERM GOAL #3   Title  Increase AROM Rt shoulder to equal or greater that AROM Lt shoulder 7.29/2020    Time  12    Period  Weeks    Status  Revised      PT LONG TERM GOAL #4   Title  Patient reports return to normal functional use of Rt UE including lifting up to 10# without pain greater than 0/10 to 2/10 at greatest 05/27/2019    Time  6    Period  Weeks    Status  Achieved      PT LONG TERM GOAL #5   Title  Improve FOTO to </= 33% limitation 07/15/2019    Time  12    Period  Weeks    Status  Revised            Plan - 06/12/19 0831    Clinical Impression Statement  Flare up of symptoms during the night and this am. Muscular tightness posterior shoulder girdle; pain anterior shoulder/bicepital groove. Limited exercise tolerance. Encouraged pt to continue with gentle exercise and watch activities closly to avoid overuse or awkward positions.    Personal Factors and Comorbidities  Comorbidity 1;Comorbidity 2;Fitness    Comorbidities  HTN; obesity    Examination-Activity Limitations  Lift;Reach Overhead;Carry    Examination-Participation Restrictions  Yard Work;Cleaning    Stability/Clinical Decision Making  Evolving/Moderate complexity    Rehab Potential  Good    PT  Frequency  2x / week    PT Duration  6 weeks    PT Treatment/Interventions  Patient/family education;ADLs/Self Care Home Management;Cryotherapy;Electrical Stimulation;Iontophoresis 4mg /ml Dexamethasone;Moist Heat;Ultrasound;Dry needling;Manual techniques;Therapeutic activities;Therapeutic exercise;Neuromuscular re-education    PT Next Visit Plan  review HEP; stretching Rt shoulder; progress active and resistive exercise Rt UE; manual work; PROM; modalities as indicated ionto as needed; add eccentric lowering activities; ? plank on counter; ? quadraped - consider DN if mm tightness persists    PT Home Exercise Plan  [id number]    Consulted and Agree with Plan of Care  Patient       Patient will benefit from skilled therapeutic intervention in order to improve the following deficits and impairments:  Postural dysfunction, Improper body mechanics, Pain, Increased fascial restricitons, Increased muscle spasms, Decreased strength, Decreased range of motion, Decreased activity tolerance, Impaired UE functional use  Visit Diagnosis: 1. Acute pain of right shoulder   2. Other symptoms and signs involving the musculoskeletal system   3. Abnormal posture   4. Muscle weakness (generalized)        Problem List Patient Active Problem List   Diagnosis Date Noted  . Traumatic incomplete tear of right rotator cuff 04/08/2019  . Lumbosacral strain 03/18/2017    Matthew Escobar Nilda Simmer PT, MPH  06/12/2019, 8:47 AM  [facility] [address] [place], [place], [address] Phone: [phone]   Fax:  [phone]  Name: Matthew Escobar MRN: [id number] Date of Birth: December 18, 1955

## 2019-06-18 ENCOUNTER — Encounter: Payer: 59 | Admitting: Rehabilitative and Restorative Service Providers"

## 2019-06-22 ENCOUNTER — Other Ambulatory Visit: Payer: Self-pay

## 2019-06-22 ENCOUNTER — Ambulatory Visit: Payer: 59 | Admitting: Rehabilitative and Restorative Service Providers"

## 2019-06-22 ENCOUNTER — Encounter: Payer: Self-pay | Admitting: Rehabilitative and Restorative Service Providers"

## 2019-06-22 DIAGNOSIS — R293 Abnormal posture: Secondary | ICD-10-CM | POA: Diagnosis not present

## 2019-06-22 DIAGNOSIS — M6281 Muscle weakness (generalized): Secondary | ICD-10-CM | POA: Diagnosis not present

## 2019-06-22 DIAGNOSIS — M25511 Pain in right shoulder: Secondary | ICD-10-CM | POA: Diagnosis not present

## 2019-06-22 DIAGNOSIS — R29898 Other symptoms and signs involving the musculoskeletal system: Secondary | ICD-10-CM

## 2019-06-22 NOTE — Patient Instructions (Signed)

## 2019-06-22 NOTE — Therapy (Signed)
[facility] [address] [place], [place], [address] Phone: [phone]   Fax:  [phone]  Physical Therapy Treatment  Patient Details  Name: Matthew Escobar MRN: [id number] Date of Birth: Sep 11, 1956 Referring Provider (PT): Dr Clementeen GrahamEvan Corey    Encounter Date: 06/22/2019  PT End of Session - 06/22/19 0802    Visit Number  14    Number of Visits  24    Date for PT Re-Evaluation  07/15/19    PT Start Time  0801    PT Stop Time  0849    PT Time Calculation (min)  48 min    Activity Tolerance  Patient tolerated treatment well       Past Medical History:  Diagnosis Date  . Hypertension     Past Surgical History:  Procedure Laterality Date  . ABDOMINAL SURGERY      There were no vitals filed for this visit.  Subjective Assessment - 06/22/19 0805    Subjective  Patient reports that his shoulder has been irritated over the weekend. He has "knotted up" over the weekeknd. Wife works on it which helps. There is a certain way that he turns that really hurts    Currently in Pain?  Yes    Pain Score  2     Pain Location  Shoulder    Pain Orientation  Right    Pain Descriptors / Indicators  Aching    Pain Type  Acute pain         [facility] PT Assessment - 06/22/19 0001      Assessment   Medical Diagnosis  Rt RC tear     Referring Provider (PT)  Dr Clementeen GrahamEvan Corey     Onset Date/Surgical Date  03/26/19    Hand Dominance  Left    Next MD Visit  05/12/2019    Prior Therapy  here for LBP       AROM   Right Shoulder Internal Rotation  38 Degrees    Right Shoulder External Rotation  79 Degrees      Strength   Right Shoulder External Rotation  4+/5   discomroft      Palpation   Palpation comment  muscular tightness through the Rt shoulder girdle - pecs; upper trap; leveator; anterior GH joint                    [facility] Adult PT Treatment/Exercise - 06/22/19 0001      Shoulder Exercises: Seated   Retraction  Limitations  scap retraction shoulder bllades down and back - 10-15 sec hold x 5       Shoulder Exercises: Standing   External Rotation  AROM;Right;5 reps   shoulder 90 deg elevation; elbow 90 deg flexion      Shoulder Exercises: Pulleys   Flexion  --   10 sec hold x 10 reps    Flexion Limitations  slow eccentric lowering - pain noted at ~ 90 deg per DN and ~ 45 deg post DN       Electrical Stimulation   Electrical Stimulation Location  Rt shoulder    Electrical Stimulation Action  IFC    Electrical Stimulation Parameters  to tolerance    Electrical Stimulation Goals  Pain;Tone      Vasopneumatic   Number Minutes Vasopneumatic   15 minutes    Vasopnuematic Location   Shoulder    Vasopneumatic Pressure  Low    Vasopneumatic Temperature   34  Manual Therapy   Manual therapy comments  pt sitting     Soft tissue mobilization  deep tissue work through posterior deltiod; infraspinitus; anterior shoulder through supraspinitus musclature and insertion and biceps Rt shoulder        Trigger Point Dry Needling - 06/22/19 0001    Consent Given?  Yes    Education Handout Provided  Yes    Other Dry Needling  Rt UE     Supraspinatus Response  Palpable increased muscle length    Deltoid Response  Palpable increased muscle length;Twitch response elicited           PT Education - 06/22/19 0839    Education Details  DN    Person(s) Educated  Patient    Methods  Explanation    Comprehension  Verbalized understanding          PT Long Term Goals - 06/03/19 0913      PT LONG TERM GOAL #1   Title  Patient I in HEP at discharge 07/15/2019    Time  12    Period  Weeks    Status  Revised      PT LONG TERM GOAL #2   Title  4+/5 to 5/5 strength Rt shoulder 07/15/2019    Time  12    Period  Weeks    Status  Revised      PT LONG TERM GOAL #3   Title  Increase AROM Rt shoulder to equal or greater that AROM Lt shoulder 7.29/2020    Time  12    Period  Weeks    Status  Revised       PT LONG TERM GOAL #4   Title  Patient reports return to normal functional use of Rt UE including lifting up to 10# without pain greater than 0/10 to 2/10 at greatest 05/27/2019    Time  6    Period  Weeks    Status  Achieved      PT LONG TERM GOAL #5   Title  Improve FOTO to </= 33% limitation 07/15/2019    Time  12    Period  Weeks    Status  Revised            Plan - 06/22/19 0802    Clinical Impression Statement  Persistent episode of incresaed pain related to activity level. Joe continues to have almost constanct low level pain with episodic flare ups related to activity. he has palpable muscular tightness through the pecs; biceps; deltoid; supraspinitus; trap. Fair tolerance for DN. Demonstrated improved eccentric control with decreased pain following DN and manual work. ER ROM remains limited. Strength into Rt shoulder ER is increased. Gradually progressing toward goals of therapy.    Personal Factors and Comorbidities  Comorbidity 1;Comorbidity 2;Fitness    Comorbidities  HTN; obesity    Examination-Activity Limitations  Lift;Reach Overhead;Carry    Examination-Participation Restrictions  Yard Work;Cleaning    Stability/Clinical Decision Making  Evolving/Moderate complexity    Rehab Potential  Good    PT Frequency  2x / week    PT Duration  6 weeks    PT Treatment/Interventions  Patient/family education;ADLs/Self Care Home Management;Cryotherapy;Electrical Stimulation;Iontophoresis 4mg /ml Dexamethasone;Moist Heat;Ultrasound;Dry needling;Manual techniques;Therapeutic activities;Therapeutic exercise;Neuromuscular re-education    PT Next Visit Plan  review HEP; stretching Rt shoulder; progress active and resistive exercise Rt UE; manual work; PROM; modalities as indicated ionto as needed; add eccentric lowering activities; ? plank on counter; ? quadraped - assess response DN - encouraged consistent  gently pec stretch    PT Home Exercise Plan  [id number]    Consulted and Agree  with Plan of Care  Patient       Patient will benefit from skilled therapeutic intervention in order to improve the following deficits and impairments:  Postural dysfunction, Improper body mechanics, Pain, Increased fascial restricitons, Increased muscle spasms, Decreased strength, Decreased range of motion, Decreased activity tolerance, Impaired UE functional use  Visit Diagnosis: 1. Acute pain of right shoulder   2. Other symptoms and signs involving the musculoskeletal system   3. Abnormal posture   4. Muscle weakness (generalized)        Problem List Patient Active Problem List   Diagnosis Date Noted  . Traumatic incomplete tear of right rotator cuff 04/08/2019  . Lumbosacral strain 03/18/2017    Lotta Frankenfield Rober MinionP Aiyah Scarpelli PT, MPH  06/22/2019, 8:49 AM  [facility] [address] [place], [place], [address] Phone: [phone]   Fax:  [phone]  Name: Matthew Escobar MRN: [id number] Date of Birth: [birth year]

## 2019-06-26 ENCOUNTER — Other Ambulatory Visit: Payer: Self-pay

## 2019-06-26 ENCOUNTER — Encounter: Payer: Self-pay | Admitting: Rehabilitative and Restorative Service Providers"

## 2019-06-26 ENCOUNTER — Ambulatory Visit: Payer: 59 | Admitting: Rehabilitative and Restorative Service Providers"

## 2019-06-26 DIAGNOSIS — M25511 Pain in right shoulder: Secondary | ICD-10-CM | POA: Diagnosis not present

## 2019-06-26 DIAGNOSIS — R29898 Other symptoms and signs involving the musculoskeletal system: Secondary | ICD-10-CM

## 2019-06-26 DIAGNOSIS — R293 Abnormal posture: Secondary | ICD-10-CM | POA: Diagnosis not present

## 2019-06-26 DIAGNOSIS — M6281 Muscle weakness (generalized): Secondary | ICD-10-CM

## 2019-06-26 NOTE — Therapy (Signed)
[facility] [address] [place], [place], [address] Phone: [phone]   Fax:  [phone]  Physical Therapy Treatment  Patient Details  Name: Jacqulyn DuckingJoseph Kazmierski MRN: [id number] Date of Birth: 12-20-1955 Referring Provider (PT): Dr Clementeen GrahamEvan Corey    Encounter Date: 06/26/2019  PT End of Session - 06/26/19 0806    Visit Number  15    Number of Visits  24    Date for PT Re-Evaluation  07/15/19    PT Start Time  0801    PT Stop Time  0850    PT Time Calculation (min)  49 min    Activity Tolerance  Patient tolerated treatment well       Past Medical History:  Diagnosis Date  . Hypertension     Past Surgical History:  Procedure Laterality Date  . ABDOMINAL SURGERY      There were no vitals filed for this visit.  Subjective Assessment - 06/26/19 0807    Subjective  Patient reports that his shoulder was sore from the DN - He has worked harder with exercises - working on Scientist, physiologicaldoorway stretch    Currently in Pain?  Yes    Pain Score  2     Pain Location  Shoulder    Pain Orientation  Right    Pain Descriptors / Indicators  Aching                       [facility] Adult PT Treatment/Exercise - 06/26/19 0001      Shoulder Exercises: Sidelying   External Rotation  Strengthening;Right;10 reps;Weights   2 sets    External Rotation Weight (lbs)  2    ABduction  AROM;Strengthening;Right;10 reps   2 sets - 0 to 90 deg      Shoulder Exercises: Standing   Flexion  AROM;Strengthening;Both;20 reps   standing working on scapular control    ABduction Limitations  scaption standing w/ noodle working on scapular control x 10 x 2 sets       Shoulder Exercises: Pulleys   Flexion  --   10 sec hold x 10 reps    Flexion Limitations  slow eccentric lowering - pain noted at ~ 90 deg per DN and ~ 45 deg post DN       Electrical Stimulation   Electrical Stimulation Location  Rt shoulder    Electrical Stimulation Action  IFC    Electrical Stimulation Parameters  to tolerance    Electrical Stimulation Goals  Pain;Tone      Iontophoresis   Type of Iontophoresis  Dexamethasone    Location  Rt anterior    Dose  120 mAmp dose/1 cc    Time  8-10 hours       Vasopneumatic   Number Minutes Vasopneumatic   15 minutes    Vasopnuematic Location   Shoulder    Vasopneumatic Pressure  Low    Vasopneumatic Temperature   34      Manual Therapy   Manual therapy comments  pt supine     Soft tissue mobilization  deep tissue work through [employer] pecs; anterior deltoid; biceps     Passive ROM  PROM Rt shoulder extension; ER in scaption; elbow extension with shoulder extension; horizontal abduction                    PT Long Term Goals - 06/03/19 0913      PT LONG TERM GOAL #1   Title  Patient I in HEP at discharge 07/15/2019    Time  12    Period  Weeks    Status  Revised      PT LONG TERM GOAL #2   Title  4+/5 to 5/5 strength Rt shoulder 07/15/2019    Time  12    Period  Weeks    Status  Revised      PT LONG TERM GOAL #3   Title  Increase AROM Rt shoulder to equal or greater that AROM Lt shoulder 7.29/2020    Time  12    Period  Weeks    Status  Revised      PT LONG TERM GOAL #4   Title  Patient reports return to normal functional use of Rt UE including lifting up to 10# without pain greater than 0/10 to 2/10 at greatest 05/27/2019    Time  6    Period  Weeks    Status  Achieved      PT LONG TERM GOAL #5   Title  Improve FOTO to </= 33% limitation 07/15/2019    Time  12    Period  Weeks    Status  Revised            Plan - 06/26/19 0806    Clinical Impression Statement  Improved ER ROM and tissue extensibility. Patient has been more consistent with HEP stretching pecs. Worked ER and abduction. Added manual work and PROM in supine. Continues to progress gradually.    Personal Factors and Comorbidities  Comorbidity 1;Comorbidity 2;Fitness    Comorbidities  HTN; obesity    Examination-Activity  Limitations  Lift;Reach Overhead;Carry    Examination-Participation Restrictions  Yard Work;Cleaning    Stability/Clinical Decision Making  Evolving/Moderate complexity    Rehab Potential  Good    PT Frequency  2x / week    PT Duration  6 weeks    PT Treatment/Interventions  Patient/family education;ADLs/Self Care Home Management;Cryotherapy;Electrical Stimulation;Iontophoresis 4mg /ml Dexamethasone;Moist Heat;Ultrasound;Dry needling;Manual techniques;Therapeutic activities;Therapeutic exercise;Neuromuscular re-education    PT Next Visit Plan  review HEP; stretching Rt shoulder; progress active and resistive exercise Rt UE; manual work; PROM; modalities as indicated ionto as needed;continue eccentric lowering activities; ? plank on counter; ? quadraped - assess response DN - encouraged consistent gently pec stretch    PT Home Exercise Plan  [id number]    Consulted and Agree with Plan of Care  Patient       Patient will benefit from skilled therapeutic intervention in order to improve the following deficits and impairments:  Postural dysfunction, Improper body mechanics, Pain, Increased fascial restricitons, Increased muscle spasms, Decreased strength, Decreased range of motion, Decreased activity tolerance, Impaired UE functional use  Visit Diagnosis: 1. Acute pain of right shoulder   2. Other symptoms and signs involving the musculoskeletal system   3. Abnormal posture   4. Muscle weakness (generalized)        Problem List Patient Active Problem List   Diagnosis Date Noted  . Traumatic incomplete tear of right rotator cuff 04/08/2019  . Lumbosacral strain 03/18/2017    Cordae Mccarey Nilda Simmer PT, MPH  06/26/2019, 8:39 AM  [facility] [place], [place], [address] Phone: [phone]   Fax:  [phone]  Name: Daryon Remmert MRN: [id number] Date of Birth: Jan 10, 1956

## 2019-06-29 ENCOUNTER — Encounter (INDEPENDENT_AMBULATORY_CARE_PROVIDER_SITE_OTHER): Payer: Self-pay

## 2019-06-29 ENCOUNTER — Encounter: Payer: Self-pay | Admitting: Rehabilitative and Restorative Service Providers"

## 2019-06-29 ENCOUNTER — Other Ambulatory Visit: Payer: Self-pay

## 2019-06-29 ENCOUNTER — Ambulatory Visit: Payer: 59 | Admitting: Rehabilitative and Restorative Service Providers"

## 2019-06-29 DIAGNOSIS — M6281 Muscle weakness (generalized): Secondary | ICD-10-CM | POA: Diagnosis not present

## 2019-06-29 DIAGNOSIS — R293 Abnormal posture: Secondary | ICD-10-CM | POA: Diagnosis not present

## 2019-06-29 DIAGNOSIS — R29898 Other symptoms and signs involving the musculoskeletal system: Secondary | ICD-10-CM | POA: Diagnosis not present

## 2019-06-29 DIAGNOSIS — M25511 Pain in right shoulder: Secondary | ICD-10-CM

## 2019-06-29 NOTE — Therapy (Signed)
[facility] [address] [place], [place], [address] Phone: [phone]   Fax:  [phone]  Physical Therapy Treatment  Patient Details  Name: Matthew DuckingJoseph Shafer MRN: [id number] Date of Birth: January 17, 1956 Referring Provider (PT): Dr Clementeen GrahamEvan Corey    Encounter Date: 06/29/2019  PT End of Session - 06/29/19 0805    Visit Number  16    Number of Visits  24    PT Start Time  0806    PT Stop Time  0851    PT Time Calculation (min)  45 min    Activity Tolerance  Patient tolerated treatment well       Past Medical History:  Diagnosis Date  . Hypertension     Past Surgical History:  Procedure Laterality Date  . ABDOMINAL SURGERY      There were no vitals filed for this visit.  Subjective Assessment - 06/29/19 0808    Subjective  "What we did at last visit helped more than anything we have done since I've been coming here" Working on exercises at home    Currently in Pain?  Yes    Pain Score  2     Pain Location  Shoulder    Pain Orientation  Right    Pain Descriptors / Indicators  Aching         [facility] PT Assessment - 06/29/19 0001      Assessment   Medical Diagnosis  Rt RC tear     Referring Provider (PT)  Dr Clementeen GrahamEvan Corey     Onset Date/Surgical Date  03/26/19    Hand Dominance  Left    Next MD Visit  05/12/2019    Prior Therapy  here for LBP       PROM   Right Shoulder External Rotation  90 Degrees   supine shd 90 deg abd in scaption                   [facility] Adult PT Treatment/Exercise - 06/29/19 0001      Shoulder Exercises: Sidelying   External Rotation  Strengthening;Right;10 reps;Weights   2 sets    External Rotation Weight (lbs)  2    ABduction  AROM;Strengthening;Right;10 reps   2 sets - 0 to 90 deg      Shoulder Exercises: Standing   Flexion  AROM;Strengthening;Both;20 reps   standing working on scapular control    ABduction Limitations  scaption standing w/ noodle working on scapular  control x 10 x 2 sets       Shoulder Exercises: Pulleys   Flexion  --   10 sec hold x 10 reps    Flexion Limitations  slow eccentric lowering - pain noted at ~ 90 deg per DN and ~ 45 deg post DN       Shoulder Exercises: Therapy Ball   Right/Left Limitations  ER small ball on wall circles  CW/CCW       Electrical Stimulation   Electrical Stimulation Location  Rt shoulder    Electrical Stimulation Action  IFC    Electrical Stimulation Parameters  to tolerance    Electrical Stimulation Goals  Pain;Tone      Iontophoresis   Type of Iontophoresis  Dexamethasone    Location  Rt anterior    Dose  120 mAmp dose/1 cc    Time  8-10 hours       Vasopneumatic   Number Minutes Vasopneumatic   15 minutes    Vasopnuematic Location  Shoulder    Vasopneumatic Pressure  Low    Vasopneumatic Temperature   34      Manual Therapy   Manual therapy comments  pt supine     Soft tissue mobilization  deep tissue work through [employer]; anterior deltoid; biceps     Passive ROM  PROM Rt shoulder extension; ER in scaption; elbow extension with shoulder extension; horizontal abduction                    PT Long Term Goals - 06/03/19 0913      PT LONG TERM GOAL #1   Title  Patient I in HEP at discharge 07/15/2019    Time  12    Period  Weeks    Status  Revised      PT LONG TERM GOAL #2   Title  4+/5 to 5/5 strength Rt shoulder 07/15/2019    Time  12    Period  Weeks    Status  Revised      PT LONG TERM GOAL #3   Title  Increase AROM Rt shoulder to equal or greater that AROM Lt shoulder 7.29/2020    Time  12    Period  Weeks    Status  Revised      PT LONG TERM GOAL #4   Title  Patient reports return to normal functional use of Rt UE including lifting up to 10# without pain greater than 0/10 to 2/10 at greatest 05/27/2019    Time  6    Period  Weeks    Status  Achieved      PT LONG TERM GOAL #5   Title  Improve FOTO to </= 33% limitation 07/15/2019    Time  12    Period  Weeks     Status  Revised            Plan - 06/29/19 0806    Clinical Impression Statement  Positive response to last treatment with work through the pecs and ionto patch. Patient continues to work on strengthening and is using Rt UE functionally - pain persists.    Personal Factors and Comorbidities  Comorbidity 1;Comorbidity 2;Fitness    Comorbidities  HTN; obesity    Examination-Activity Limitations  Lift;Reach Overhead;Carry    Examination-Participation Restrictions  Yard Work;Cleaning    Stability/Clinical Decision Making  Evolving/Moderate complexity    Rehab Potential  Good    PT Frequency  2x / week    PT Duration  6 weeks    PT Treatment/Interventions  Patient/family education;ADLs/Self Care Home Management;Cryotherapy;Electrical Stimulation;Iontophoresis 4mg /ml Dexamethasone;Moist Heat;Ultrasound;Dry needling;Manual techniques;Therapeutic activities;Therapeutic exercise;Neuromuscular re-education    PT Next Visit Plan  review HEP; stretching Rt shoulder; progress active and resistive exercise Rt UE; manual work; PROM; modalities as indicated ionto as needed;continue eccentric lowering activities; ? plank on counter; ? quadraped - assess response DN - encouraged consistent gently pec stretch    PT Home Exercise Plan  [id number]    Consulted and Agree with Plan of Care  Patient       Patient will benefit from skilled therapeutic intervention in order to improve the following deficits and impairments:  Postural dysfunction, Improper body mechanics, Pain, Increased fascial restricitons, Increased muscle spasms, Decreased strength, Decreased range of motion, Decreased activity tolerance, Impaired UE functional use  Visit Diagnosis: 1. Acute pain of right shoulder   2. Other symptoms and signs involving the musculoskeletal system   3. Abnormal posture   4. Muscle weakness (generalized)  Problem List Patient Active Problem List   Diagnosis Date Noted  . Traumatic incomplete  tear of right rotator cuff 04/08/2019  . Lumbosacral strain 03/18/2017    [facility] Rober MinionP Joniah Bednarski PT, MPH  06/29/2019, 8:49 AM  [facility] [address] [place], [place], [address] Phone: [phone]   Fax:  [phone]  Name: Matthew DuckingJoseph Kadlec MRN: [id number] Date of Birth: 10-Aug-1956

## 2019-07-03 ENCOUNTER — Other Ambulatory Visit: Payer: Self-pay

## 2019-07-03 ENCOUNTER — Ambulatory Visit: Payer: 59 | Admitting: Rehabilitative and Restorative Service Providers"

## 2019-07-03 ENCOUNTER — Encounter: Payer: Self-pay | Admitting: Rehabilitative and Restorative Service Providers"

## 2019-07-03 DIAGNOSIS — R293 Abnormal posture: Secondary | ICD-10-CM | POA: Diagnosis not present

## 2019-07-03 DIAGNOSIS — M6281 Muscle weakness (generalized): Secondary | ICD-10-CM | POA: Diagnosis not present

## 2019-07-03 DIAGNOSIS — R29898 Other symptoms and signs involving the musculoskeletal system: Secondary | ICD-10-CM | POA: Diagnosis not present

## 2019-07-03 DIAGNOSIS — M25511 Pain in right shoulder: Secondary | ICD-10-CM

## 2019-07-03 NOTE — Therapy (Signed)
[place], [place], [address] Phone: [phone]   Fax:  [phone]  Physical Therapy Treatment  Patient Details  Name: Matthew Escobar MRN: [id number] Date of Birth: 02/01/1956 Referring Provider (PT): Dr Lynne Leader    Encounter Date: 07/03/2019  PT End of Session - 07/03/19 0805    Visit Number  17    Number of Visits  24    Date for PT Re-Evaluation  07/15/19    PT Start Time  0800    PT Stop Time  7322    PT Time Calculation (min)  47 min    Activity Tolerance  Patient tolerated treatment well       Past Medical History:  Diagnosis Date  . Hypertension     Past Surgical History:  Procedure Laterality Date  . ABDOMINAL SURGERY      There were no vitals filed for this visit.  Subjective Assessment - 07/03/19 0806    Subjective  Patient awoke with pain in the Rt shoulder into the neck.    Currently in Pain?  Yes    Pain Score  8     Pain Location  Shoulder    Pain Orientation  Right    Pain Descriptors / Indicators  Burning    Pain Type  Acute pain    Pain Onset  More than a month ago    Pain Frequency  Constant                       [facility] Adult PT Treatment/Exercise - 07/03/19 0001      Shoulder Exercises: Standing   Flexion  AROM;Strengthening;Both;20 reps   standing working on scapular control    ABduction Limitations  scaption standing w/ noodle working on scapular control x 10 x 2 sets     Other Standing Exercises  wall push up x 20       Shoulder Exercises: Pulleys   Flexion  --   10 sec hold x 10 reps    Flexion Limitations  slow eccentric lowering - pain noted at ~ 90 deg per DN and ~ 45 deg post DN       Electrical Stimulation   Electrical Stimulation Location  Rt shoulder    Electrical Stimulation Action  IFC    Electrical Stimulation Parameters  to tolerance    Electrical Stimulation Goals  Pain;Tone      Iontophoresis   Type of  Iontophoresis  Dexamethasone    Location  Rt anterior    Dose  120 mAmp dose/1 cc    Time  8-10 hours       Vasopneumatic   Number Minutes Vasopneumatic   15 minutes    Vasopnuematic Location   Shoulder    Vasopneumatic Pressure  Low    Vasopneumatic Temperature   34      Manual Therapy   Manual therapy comments  pt supine     Soft tissue mobilization  deep tissue work through [employer]; anterior deltoid; upper trap; posterior shoulder girdle; traps      Passive ROM  lateral cervical flexion stretch to address upper trap tightness     Manual Traction  traction through ling arm                  PT Long Term Goals - 06/03/19 0913      PT LONG TERM GOAL #1   Title  Patient I in HEP  at discharge 07/15/2019    Time  12    Period  Weeks    Status  Revised      PT LONG TERM GOAL #2   Title  4+/5 to 5/5 strength Rt shoulder 07/15/2019    Time  12    Period  Weeks    Status  Revised      PT LONG TERM GOAL #3   Title  Increase AROM Rt shoulder to equal or greater that AROM Lt shoulder 7.29/2020    Time  12    Period  Weeks    Status  Revised      PT LONG TERM GOAL #4   Title  Patient reports return to normal functional use of Rt UE including lifting up to 10# without pain greater than 0/10 to 2/10 at greatest 05/27/2019    Time  6    Period  Weeks    Status  Achieved      PT LONG TERM GOAL #5   Title  Improve FOTO to </= 33% limitation 07/15/2019    Time  12    Period  Weeks    Status  Revised            Plan - 07/03/19 0805    Clinical Impression Statement  Significant pain in the Rt shoulder/neck area - noted muscle tightness through the Rt upper trap/leveator; lateral cervical spine. Patient likely slept in an awkward position creating muscular tightness and pain. Significantly improved post treatment.    Personal Factors and Comorbidities  Comorbidity 1;Comorbidity 2;Fitness    Comorbidities  HTN; obesity    Examination-Activity Limitations  Lift;Reach  Overhead;Carry    Examination-Participation Restrictions  Yard Work;Cleaning    Stability/Clinical Decision Making  Evolving/Moderate complexity    Rehab Potential  Good    PT Frequency  2x / week    PT Duration  6 weeks    PT Treatment/Interventions  Patient/family education;ADLs/Self Care Home Management;Cryotherapy;Electrical Stimulation;Iontophoresis 4mg /ml Dexamethasone;Moist Heat;Ultrasound;Dry needling;Manual techniques;Therapeutic activities;Therapeutic exercise;Neuromuscular re-education    PT Next Visit Plan  review HEP; stretching Rt shoulder; progress active and resistive exercise Rt UE; manual work; PROM; modalities as indicated ionto as needed;continue eccentric lowering activities; ? plank on counter; ? quadraped - assess response DN - encouraged consistent gently pec stretch    PT Home Exercise Plan  [id number]    Consulted and Agree with Plan of Care  Patient       Patient will benefit from skilled therapeutic intervention in order to improve the following deficits and impairments:  Postural dysfunction, Improper body mechanics, Pain, Increased fascial restricitons, Increased muscle spasms, Decreased strength, Decreased range of motion, Decreased activity tolerance, Impaired UE functional use  Visit Diagnosis: 1. Acute pain of right shoulder   2. Other symptoms and signs involving the musculoskeletal system   3. Abnormal posture   4. Muscle weakness (generalized)        Problem List Patient Active Problem List   Diagnosis Date Noted  . Traumatic incomplete tear of right rotator cuff 04/08/2019  . Lumbosacral strain 03/18/2017    Celyn Rober MinionP Holt PT, MPH  07/03/2019, 8:44 AM  [facility] [address] [place], [place], [address] Phone: [phone]   Fax:  [phone]  Name: Matthew DuckingJoseph Escobar MRN: [id number] Date of Birth: 12/08/1956

## 2019-07-06 ENCOUNTER — Encounter: Payer: Self-pay | Admitting: Rehabilitative and Restorative Service Providers"

## 2019-07-06 ENCOUNTER — Ambulatory Visit (INDEPENDENT_AMBULATORY_CARE_PROVIDER_SITE_OTHER): Payer: 59 | Admitting: Rehabilitative and Restorative Service Providers"

## 2019-07-06 ENCOUNTER — Other Ambulatory Visit: Payer: Self-pay

## 2019-07-06 DIAGNOSIS — R293 Abnormal posture: Secondary | ICD-10-CM | POA: Diagnosis not present

## 2019-07-06 DIAGNOSIS — M6281 Muscle weakness (generalized): Secondary | ICD-10-CM

## 2019-07-06 DIAGNOSIS — M25511 Pain in right shoulder: Secondary | ICD-10-CM

## 2019-07-06 DIAGNOSIS — R29898 Other symptoms and signs involving the musculoskeletal system: Secondary | ICD-10-CM | POA: Diagnosis not present

## 2019-07-06 NOTE — Therapy (Addendum)
[place], [place], [address] Phone: [phone]   Fax:  [phone]  Physical Therapy Treatment  Patient Details  Name: Matthew Escobar MRN: [id number] Date of Birth: Apr 21, 1956 Referring Provider (PT): Dr Lynne Leader    Encounter Date: 07/06/2019  PT End of Session - 07/06/19 0803    Visit Number  18    Number of Visits  24    Date for PT Re-Evaluation  07/15/19    PT Start Time  0802    PT Stop Time  0852    PT Time Calculation (min)  50 min    Activity Tolerance  Patient tolerated treatment well       Past Medical History:  Diagnosis Date  . Hypertension     Past Surgical History:  Procedure Laterality Date  . ABDOMINAL SURGERY      There were no vitals filed for this visit.  Subjective Assessment - 07/06/19 0803    Subjective  Shoulder is better than it was on Friday. Felt pretty good yesterday - back to the same pain in the top of the shoulder. That pain seems to be there "all the time"    Currently in Pain?  Yes    Pain Location  Shoulder    Pain Orientation  Right    Pain Descriptors / Indicators  Aching;Nagging    Pain Type  Acute pain    Pain Onset  More than a month ago    Pain Frequency  Constant    Aggravating Factors   use of Rt arm; lifting; reaching         [facility] PT Assessment - 07/06/19 0001      Assessment   Medical Diagnosis  Rt RC tear     Referring Provider (PT)  Dr Lynne Leader     Onset Date/Surgical Date  03/26/19    Hand Dominance  Left    Next MD Visit  05/12/2019    Prior Therapy  here for LBP       PROM   Right Shoulder External Rotation  90 Degrees   supine shoulder 90 deg abduction; elbow flexed      Strength   Right Shoulder External Rotation  --   4+/5 to 5-/5 some mild discomfort      Palpation   Palpation comment  muscular tightness through the Rt shoulder girdle - pecs; upper trap; leveator; posterior through the teres/lat area -  tender/painful anterior [place] area                    [facility] Adult PT Treatment/Exercise - 07/06/19 0001      Shoulder Exercises: Seated   Abduction  AROM;Strengthening;Right;10 reps   scaption      Shoulder Exercises: Standing   ABduction Limitations  scaption standing working on scapular control x 10 x 2 sets       Shoulder Exercises: Pulleys   Scaption  --   slow eccentric lowering x 10 reps      Shoulder Exercises: ROM/Strengthening   UBE (Upper Arm Bike)  L4 x 4 min 2 min fwd 2 back       Shoulder Exercises: Stretch   Other Shoulder Stretches  pec stretch 3 positions x 30 sec x 3 reps; shoulder flexion stepping under doorway 30 sec x 2 reps       Electrical Stimulation   Electrical Stimulation Location  Rt shoulder    Electrical Stimulation Action  IFC  Electrical Stimulation Parameters  to tolerance     Electrical Stimulation Goals  Pain;Tone      Vasopneumatic   Number Minutes Vasopneumatic   15 minutes    Vasopnuematic Location   Shoulder    Vasopneumatic Pressure  Low    Vasopneumatic Temperature   34      Manual Therapy   Manual therapy comments  pt supine and sidelying     Soft tissue mobilization  deep tissue work through [employer]; anterior deltoid; posterior shoulder/teres/lats     Passive ROM  Rt shoulder flexion; scaption; ER; IR     Manual Traction  traction through ling arm                  PT Long Term Goals - 06/03/19 0913      PT LONG TERM GOAL #1   Title  Patient I in HEP at discharge 07/15/2019    Time  12    Period  Weeks    Status  Revised      PT LONG TERM GOAL #2   Title  4+/5 to 5/5 strength Rt shoulder 07/15/2019    Time  12    Period  Weeks    Status  Revised      PT LONG TERM GOAL #3   Title  Increase AROM Rt shoulder to equal or greater that AROM Lt shoulder 7.29/2020    Time  12    Period  Weeks    Status  Revised      PT LONG TERM GOAL #4   Title  Patient reports return to normal functional use of Rt UE  including lifting up to 10# without pain greater than 0/10 to 2/10 at greatest 05/27/2019    Time  6    Period  Weeks    Status  Achieved      PT LONG TERM GOAL #5   Title  Improve FOTO to </= 33% limitation 07/15/2019    Time  12    Period  Weeks    Status  Revised            Plan - 07/06/19 0803    Clinical Impression Statement  Good improvement in flare up of pain the end of the week last week. Muscular tightness continues in the anterior chest/pecs and biceps. Good response to manual work and stretching.    Personal Factors and Comorbidities  Comorbidity 1;Comorbidity 2;Fitness    Comorbidities  HTN; obesity    Examination-Activity Limitations  Lift;Reach Overhead;Carry    Examination-Participation Restrictions  Yard Work;Cleaning    Stability/Clinical Decision Making  Evolving/Moderate complexity    Rehab Potential  Good    PT Frequency  2x / week    PT Duration  6 weeks    PT Treatment/Interventions  Patient/family education;ADLs/Self Care Home Management;Cryotherapy;Electrical Stimulation;Iontophoresis 59m/ml Dexamethasone;Moist Heat;Ultrasound;Dry needling;Manual techniques;Therapeutic activities;Therapeutic exercise;Neuromuscular re-education    PT Next Visit Plan  review HEP; stretching Rt shoulder; progress active and resistive exercise Rt UE; manual work; PROM; modalities as indicated ionto as needed;continue eccentric lowering activities; ? plank on counter; ? quadraped - assess response DN - encouraged consistent gently pec stretch    PT Home Exercise Plan  [id number]    Consulted and Agree with Plan of Care  Patient       Patient will benefit from skilled therapeutic intervention in order to improve the following deficits and impairments:  Postural dysfunction, Improper body mechanics, Pain, Increased fascial restricitons, Increased muscle spasms, Decreased  strength, Decreased range of motion, Decreased activity tolerance, Impaired UE functional use  Visit  Diagnosis: 1. Acute pain of right shoulder   2. Other symptoms and signs involving the musculoskeletal system   3. Abnormal posture   4. Muscle weakness (generalized)        Problem List Patient Active Problem List   Diagnosis Date Noted  . Traumatic incomplete tear of right rotator cuff 04/08/2019  . Lumbosacral strain 03/18/2017    Matthew Escobar Nilda Simmer PT, MPH  07/06/2019, 9:02 AM  [facility] [place], [place], [address] Phone: [phone]   Fax:  [phone]  Name: Reagan Klemz MRN: [id number] Date of Birth: 1956-11-24  PHYSICAL THERAPY DISCHARGE SUMMARY  Visits from Start of Care: 18  Current functional level related to goals / functional outcomes: See last progress note and MD note for discharge status    Remaining deficits: Continued intermittent pain - needs to continue working on strength as instructed    Education / Equipment: HEP  Plan: Patient agrees to discharge.  Patient goals were not met. Patient is being discharged due to being pleased with the current functional level.  ?????    Aniylah Avans P. Helene Kelp PT, MPH 08/10/19 3:57 PM

## 2019-07-10 ENCOUNTER — Encounter: Payer: 59 | Admitting: Rehabilitative and Restorative Service Providers"

## 2019-07-13 ENCOUNTER — Encounter: Payer: 59 | Admitting: Rehabilitative and Restorative Service Providers"

## 2019-07-17 ENCOUNTER — Ambulatory Visit: Payer: 59 | Admitting: Family Medicine

## 2019-07-17 ENCOUNTER — Encounter: Payer: 59 | Admitting: Rehabilitative and Restorative Service Providers"

## 2019-07-22 ENCOUNTER — Encounter: Payer: Self-pay | Admitting: Family Medicine

## 2019-07-22 ENCOUNTER — Ambulatory Visit (INDEPENDENT_AMBULATORY_CARE_PROVIDER_SITE_OTHER): Payer: 59 | Admitting: Family Medicine

## 2019-07-22 ENCOUNTER — Other Ambulatory Visit: Payer: Self-pay

## 2019-07-22 VITALS — BP 166/86 | HR 75 | Temp 98.1°F | Wt 232.0 lb

## 2019-07-22 DIAGNOSIS — M25511 Pain in right shoulder: Secondary | ICD-10-CM

## 2019-07-22 NOTE — Patient Instructions (Signed)
Thank you for coming in today. Continue home exercises.  I am ok to re-authorize PT in the future if needed.  Recheck as needed.  Keep me updated.  If you are not good enough let me know and I can place a referral to a surgeon for a second opinion.

## 2019-07-22 NOTE — Progress Notes (Signed)
Matthew Escobar is a 63 y.o. male who [facility] today for right shoulder pain  Patient suffered a right shoulder injury on March 26, 2019 while doing weightlifting at home.  He was seen and subsequently found to have an incomplete rotator cuff tear on ultrasound and MRI.  He is done extensive physical therapy.  He was last seen in person on May 26 and was improving quite a bit.  Physical therapy was continued.  His last visit with physical therapy was on July 20.  He is feeling pretty well. Most of the pain has resolved. He would like to avoid surgery if able.    ROS:  As above  Exam:  BP (!) 166/86   Pulse 75   Temp 98.1 F (36.7 C) (Oral)   Wt 232 lb (105.2 kg)   BMI 34.26 kg/m  Wt Readings from Last 5 Encounters:  07/22/19 232 lb (105.2 kg)  05/12/19 229 lb (103.9 kg)  04/08/19 233 lb (105.7 kg)  03/30/19 234 lb (106.1 kg)  03/27/19 230 lb (104.3 kg)   General: Well Developed, well nourished, and in no acute distress.  Neuro/Psych: Alert and oriented x3, extra-ocular muscles intact, able to move all 4 extremities, sensation grossly intact. Skin: Warm and dry, no rashes noted.  Respiratory: Not using accessory muscles, speaking in full sentences, trachea midline.  Cardiovascular: Pulses palpable, no extremity edema. Abdomen: Does not appear distended. MSK: Right shoulder: Normal appearing Normal ROM Strength intact except for external rotation 4/5 Negative hawking and neers test.     Lab and Radiology Results EXAM: MRI OF THE RIGHT SHOULDER WITHOUT CONTRAST  TECHNIQUE: Multiplanar, multisequence MR imaging of the shoulder was performed. No intravenous contrast was administered.  COMPARISON:  None.  FINDINGS: Rotator cuff: Moderate tendinosis of the supraspinatus tendon with a 10 mm full-thickness tear of the anterior supraspinatus tendon. Infraspinatus tendon is intact. Teres minor tendon is intact.  Mild tendinosis of the subscapularis tendon.  Muscles: Mild atrophy of the teres minor muscle. Remainder the rotator cuff muscles are normal.  Biceps long head:  Intact.  Acromioclavicular Joint: Mild arthropathy of the acromioclavicular joint. Type II acromion. Small amount of subacromial/subdeltoid bursal fluid.  Glenohumeral Joint: No joint effusion.  No chondral defect.  Labrum: Grossly intact, but evaluation is limited by lack of intraarticular fluid.  Bones:  No acute osseous abnormality.  No aggressive osseous lesion.  Other: No fluid collection or hematoma.  IMPRESSION: 1. Moderate tendinosis of the supraspinatus tendon with a 10 mm full-thickness tear of the anterior supraspinatus tendon. 2. Mild tendinosis of the subscapularis tendon. 3. Mild subacromial/subdeltoid bursitis.   Electronically Signed   By: Elige KoHetal  Patel   On: 04/05/2019 11:07 I personally (independently) visualized and performed the interpretation of the images attached in this note.     Assessment and Plan: 63 y.o. male with right shoulder pain due to incomplete RTC tear.  Continued mild symptoms however doing well overall.  We had lengthy discussion regarding next steps and options.  He is doing pretty well overall and would like to avoid surgery.  I think it is reasonable to proceed with home exercise program with intermittent limited physical therapy.  However if just not getting better or worsening ultimately may make sense to refer to orthopedic surgery.  Informed patient that I will be happy to reorder physical therapy in the future if needed.  All he has to do is let me know. Recheck back with  me as needed.  I spent 15 minutes with this patient, greater than 50% was face-to-face time counseling regarding diagnosis treatment plan options and next steps.   PDMP not reviewed this encounter. No orders of the defined types were placed in this encounter.  No orders of the defined  types were placed in this encounter.   Historical information moved to improve visibility of documentation.  Past Medical History:  Diagnosis Date  . Hypertension    Past Surgical History:  Procedure Laterality Date  . ABDOMINAL SURGERY     Social History   Tobacco Use  . Smoking status: Current Every Day Smoker  . Smokeless tobacco: Never Used  Substance Use Topics  . Alcohol use: Yes    Alcohol/week: 0.0 standard drinks   family history includes Breast cancer in his maternal grandmother and sister.  Medications: Current Outpatient Medications  Medication Sig Dispense Refill  . atorvastatin (LIPITOR) 10 MG tablet Take 10 mg by mouth.    . doxycycline (VIBRA-TABS) 100 MG tablet Take 1 tablet (100 mg total) by mouth 2 (two) times daily. 14 tablet 0  . lisinopril (PRINIVIL,ZESTRIL) 10 MG tablet TAKE ONE (1) TABLET BY MOUTH EVERY DAY  11  . LORazepam (ATIVAN) 1 MG tablet TAKE ONE TABLET BY MOUTH TWICE EVERY DAY -- AS NEEDED  0  . oxyCODONE-acetaminophen (PERCOCET/ROXICET) 5-325 MG tablet Take 1 tablet by mouth every 8 (eight) hours as needed for severe pain. 15 tablet 0   No current facility-administered medications for this visit.    Allergies  Allergen Reactions  . Sulfa Antibiotics       Discussed warning signs or symptoms. Please see discharge instructions. Patient expresses understanding.

## 2019-10-30 ENCOUNTER — Other Ambulatory Visit: Payer: Self-pay | Admitting: Internal Medicine

## 2019-10-30 DIAGNOSIS — J439 Emphysema, unspecified: Secondary | ICD-10-CM

## 2019-10-30 DIAGNOSIS — I700 Atherosclerosis of aorta: Secondary | ICD-10-CM

## 2019-11-04 ENCOUNTER — Ambulatory Visit: Payer: 59

## 2019-12-15 ENCOUNTER — Ambulatory Visit
Admission: RE | Admit: 2019-12-15 | Discharge: 2019-12-15 | Disposition: A | Discharge status: Home / Self Care | Payer: 59 | Source: Ambulatory Visit | Attending: Internal Medicine | Admitting: Internal Medicine

## 2019-12-15 ENCOUNTER — Other Ambulatory Visit: Payer: Self-pay | Admitting: Internal Medicine

## 2019-12-15 DIAGNOSIS — J439 Emphysema, unspecified: Secondary | ICD-10-CM

## 2019-12-15 DIAGNOSIS — Z122 Encounter for screening for malignant neoplasm of respiratory organs: Secondary | ICD-10-CM

## 2019-12-15 DIAGNOSIS — I700 Atherosclerosis of aorta: Secondary | ICD-10-CM

## 2020-12-15 ENCOUNTER — Other Ambulatory Visit: Payer: Self-pay | Admitting: Internal Medicine

## 2020-12-15 DIAGNOSIS — I700 Atherosclerosis of aorta: Secondary | ICD-10-CM

## 2020-12-15 DIAGNOSIS — J439 Emphysema, unspecified: Secondary | ICD-10-CM

## 2021-01-05 ENCOUNTER — Ambulatory Visit
Admission: RE | Admit: 2021-01-05 | Discharge: 2021-01-05 | Disposition: A | Discharge status: Home / Self Care | Payer: BLUE CROSS/BLUE SHIELD | Source: Ambulatory Visit | Attending: Internal Medicine | Admitting: Internal Medicine

## 2021-01-05 ENCOUNTER — Other Ambulatory Visit: Payer: Self-pay

## 2021-01-05 DIAGNOSIS — J439 Emphysema, unspecified: Secondary | ICD-10-CM

## 2021-01-05 DIAGNOSIS — I700 Atherosclerosis of aorta: Secondary | ICD-10-CM

## 2022-02-13 ENCOUNTER — Other Ambulatory Visit: Payer: Self-pay | Admitting: Internal Medicine

## 2022-02-13 DIAGNOSIS — F17210 Nicotine dependence, cigarettes, uncomplicated: Secondary | ICD-10-CM

## 2022-02-13 DIAGNOSIS — J439 Emphysema, unspecified: Secondary | ICD-10-CM

## 2022-02-28 ENCOUNTER — Other Ambulatory Visit: Payer: Self-pay | Admitting: Internal Medicine

## 2022-02-28 DIAGNOSIS — F17210 Nicotine dependence, cigarettes, uncomplicated: Secondary | ICD-10-CM

## 2022-02-28 DIAGNOSIS — J439 Emphysema, unspecified: Secondary | ICD-10-CM

## 2022-03-29 ENCOUNTER — Ambulatory Visit
Admission: RE | Admit: 2022-03-29 | Discharge: 2022-03-29 | Disposition: A | Discharge status: Home / Self Care | Payer: 59 | Source: Ambulatory Visit | Attending: Internal Medicine | Admitting: Internal Medicine

## 2022-03-29 DIAGNOSIS — F17210 Nicotine dependence, cigarettes, uncomplicated: Secondary | ICD-10-CM

## 2022-03-29 DIAGNOSIS — J439 Emphysema, unspecified: Secondary | ICD-10-CM

## 2022-08-13 IMAGING — CT CT CHEST LUNG CANCER SCREENING LOW DOSE W/O CM
1 series · 10 of 10 positions shown, 13 images · non-contrast
Comparison: Lung cancer screening CT dated January 05, 2021

CLINICAL DATA: Current smoker with 30 pack-year history



[ct lung segmentation data · axial · 0.83mm/px · z∈[+240,+240]mm · 10 of 305 frames shown]
[frame 1/305  mediastinal]
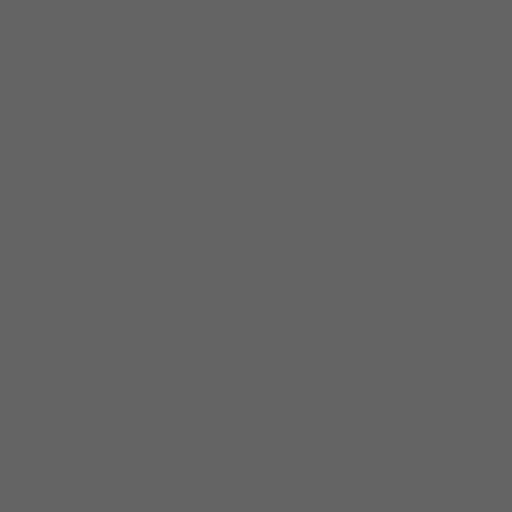
[frame 1/305  lung]
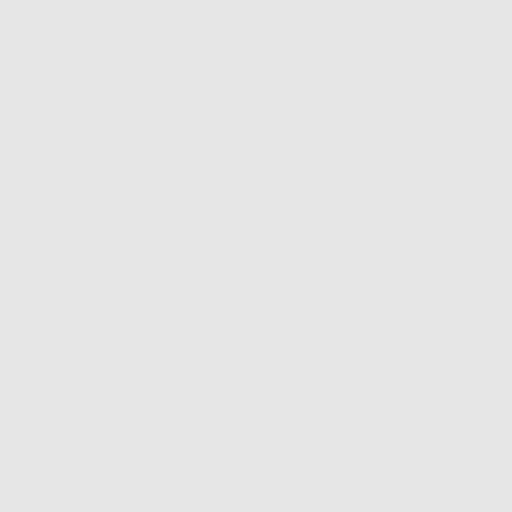
[frame 34/305  lung]
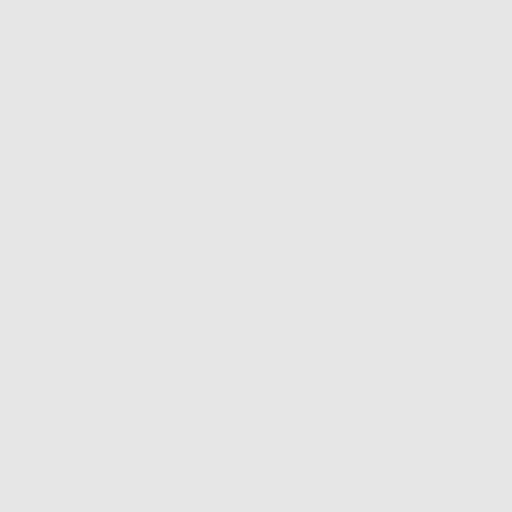
[frame 68/305  lung]
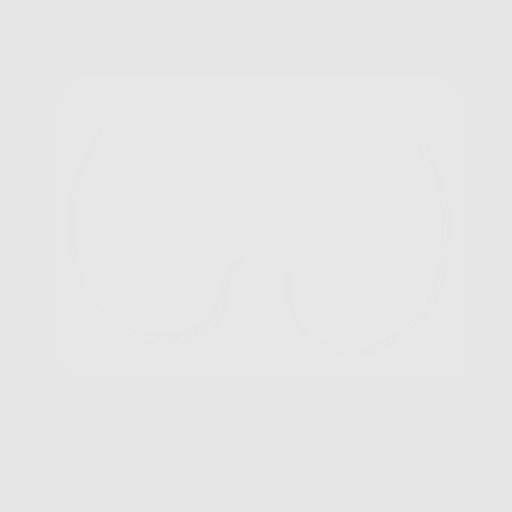
[frame 102/305  lung]
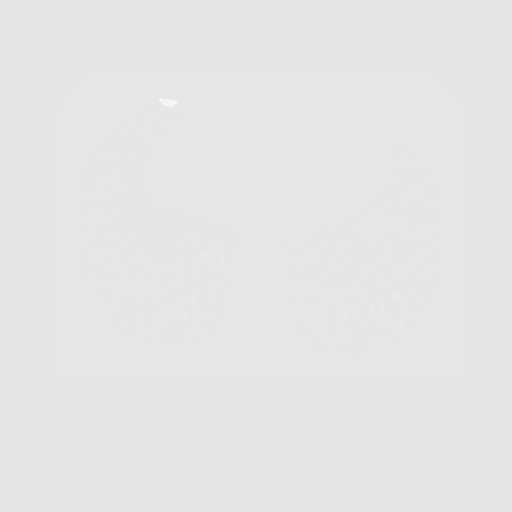
[frame 136/305  mediastinal]
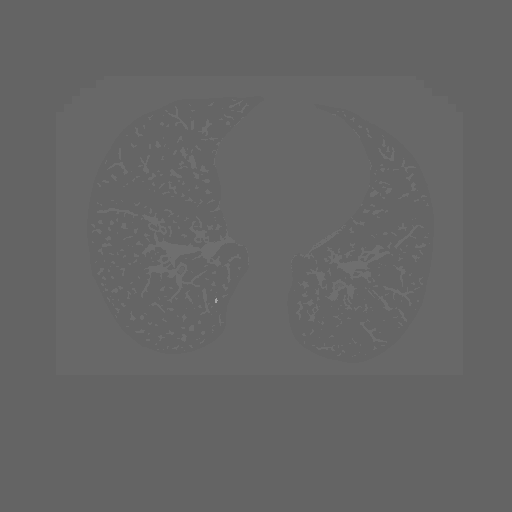
[frame 136/305  lung]
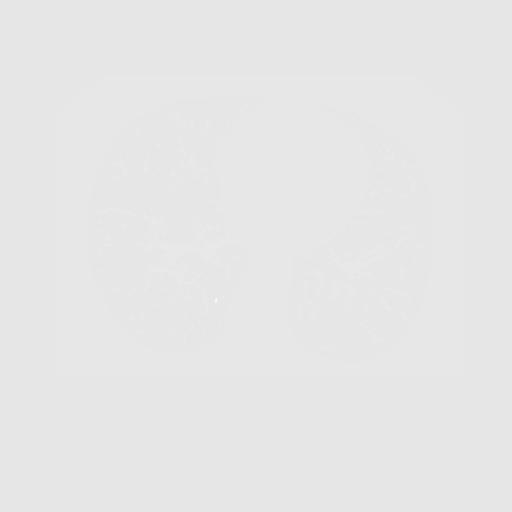
[frame 169/305  lung]
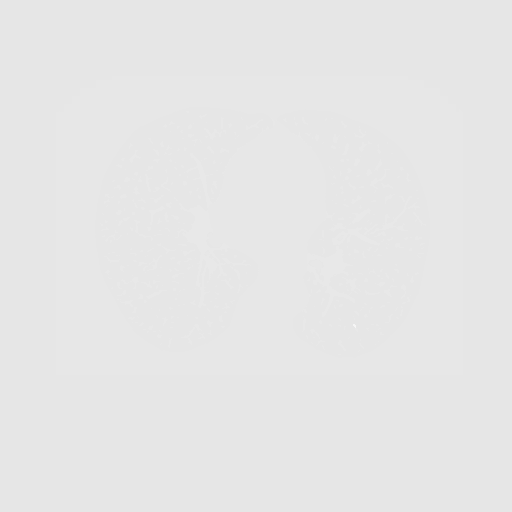
[frame 203/305  lung]
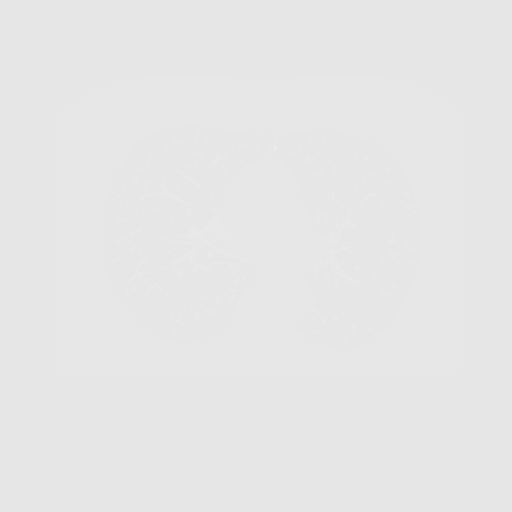
[frame 237/305  lung]
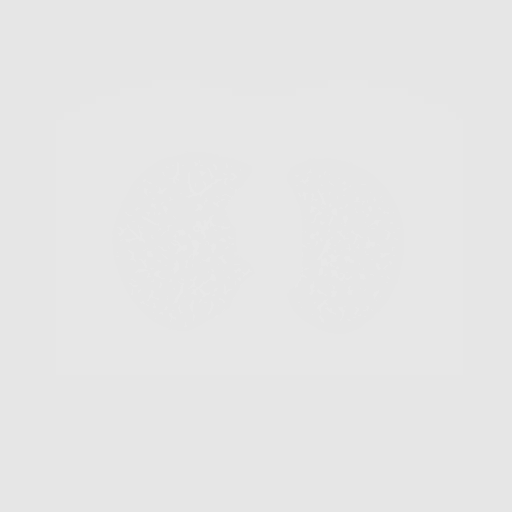
[frame 271/305  mediastinal]
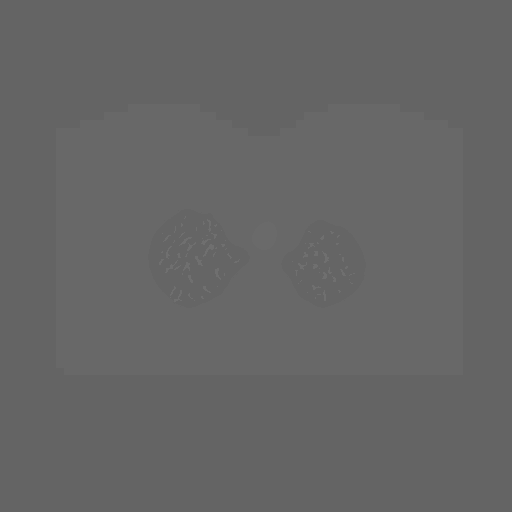
[frame 271/305  lung]
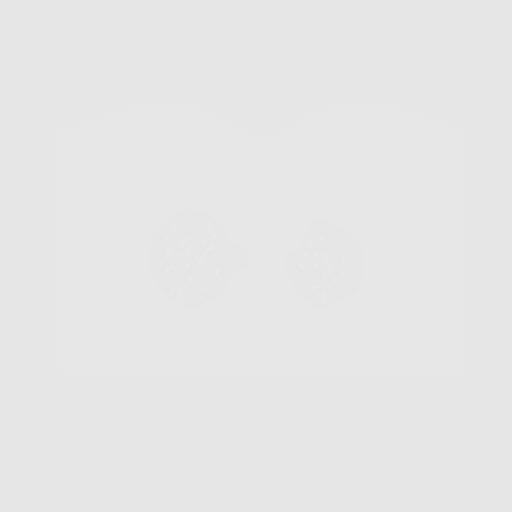
[frame 305/305  lung]
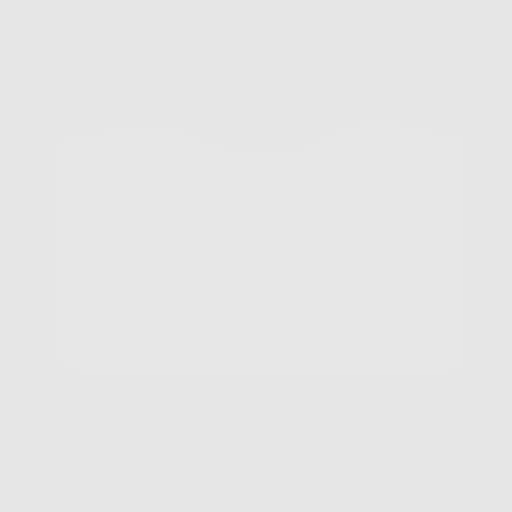

[10 of 10 positions shown; findings below may reference images not displayed]

FINDINGS: Cardiovascular: Normal heart size. No pericardial effusion. Coronary
artery calcifications of the circumflex and RCA. Atherosclerotic
disease of the thoracic aorta.

Mediastinum/Nodes: Esophagus and thyroid are unremarkable. No
pathologically enlarged lymph nodes seen in the chest.

Lungs/Pleura: Central airways are patent. Mild centrilobular
emphysema. No consolidation, pleural effusion or pneumothorax.
Stable small solid pulmonary nodules. Reference nodule the right
middle lobe measuring 4.0 cm in mean diameter on image 141.

Upper Abdomen: No acute abnormality.

Musculoskeletal: No chest wall mass or suspicious bone lesions
identified.
IMPRESSION: 1. Lung-RADS 2, benign appearance or behavior. Continue annual
screening with low-dose chest CT without contrast in 12 months.
2. Coronary artery calcifications, aortic Atherosclerosis
([facility]CUB-TA2.2) and Emphysema ([facility]B-FTI.H).

## 2023-02-18 ENCOUNTER — Other Ambulatory Visit: Payer: Self-pay | Admitting: Internal Medicine

## 2023-02-18 DIAGNOSIS — J449 Chronic obstructive pulmonary disease, unspecified: Secondary | ICD-10-CM

## 2023-02-18 DIAGNOSIS — F17200 Nicotine dependence, unspecified, uncomplicated: Secondary | ICD-10-CM

## 2023-03-15 ENCOUNTER — Inpatient Hospital Stay: Admission: RE | Admit: 2023-03-15 | Payer: 59 | Source: Ambulatory Visit

## 2023-04-12 ENCOUNTER — Other Ambulatory Visit: Payer: Medicare Other

## 2023-04-12 ENCOUNTER — Ambulatory Visit
Admission: RE | Admit: 2023-04-12 | Discharge: 2023-04-12 | Disposition: A | Discharge status: Home / Self Care | Payer: Medicare Other | Source: Ambulatory Visit | Attending: Internal Medicine | Admitting: Internal Medicine

## 2023-04-12 DIAGNOSIS — F17200 Nicotine dependence, unspecified, uncomplicated: Secondary | ICD-10-CM

## 2023-04-12 DIAGNOSIS — J449 Chronic obstructive pulmonary disease, unspecified: Secondary | ICD-10-CM

## 2024-11-17 ENCOUNTER — Other Ambulatory Visit: Payer: Self-pay | Admitting: Internal Medicine

## 2024-11-17 DIAGNOSIS — F17210 Nicotine dependence, cigarettes, uncomplicated: Secondary | ICD-10-CM

## 2024-12-22 ENCOUNTER — Ambulatory Visit
Admission: RE | Admit: 2024-12-22 | Discharge: 2024-12-22 | Disposition: A | Source: Ambulatory Visit | Attending: Internal Medicine | Admitting: Internal Medicine

## 2024-12-22 DIAGNOSIS — F17210 Nicotine dependence, cigarettes, uncomplicated: Secondary | ICD-10-CM
